# Patient Record
Sex: Female | Born: 2000 | Hispanic: No | Marital: Single | Smoking: Never smoker
Health system: Southern US, Community
[De-identification: ages and names within clinical notes are randomized; demographics above are authoritative.]

## PROBLEM LIST (undated history)

## (undated) DIAGNOSIS — R51 Headache: Secondary | ICD-10-CM

## (undated) DIAGNOSIS — R569 Unspecified convulsions: Secondary | ICD-10-CM

## (undated) DIAGNOSIS — E739 Lactose intolerance, unspecified: Secondary | ICD-10-CM

## (undated) HISTORY — DX: Headache: R51

## (undated) HISTORY — DX: Lactose intolerance, unspecified: E73.9

## (undated) HISTORY — PX: MOUTH SURGERY: SHX715

---
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
## 2019-04-18 ENCOUNTER — Ambulatory Visit (INDEPENDENT_AMBULATORY_CARE_PROVIDER_SITE_OTHER): Payer: Medicaid Other | Admitting: Pediatrics

## 2019-04-18 ENCOUNTER — Encounter (INDEPENDENT_AMBULATORY_CARE_PROVIDER_SITE_OTHER): Payer: Self-pay | Admitting: Pediatrics

## 2019-04-18 VITALS — BP 108/62 | HR 80 | Ht 65.5 in | Wt 115.6 lb

## 2019-04-18 DIAGNOSIS — G40309 Generalized idiopathic epilepsy and epileptic syndromes, not intractable, without status epilepticus: Secondary | ICD-10-CM

## 2019-04-18 NOTE — Progress Notes (Signed)
Patient: Lindsay BlakeSelina S Rosario MRN: [id number] Sex: female DOB: Feb 07, 2001  Provider: Ellison CarwinWilliam Hickling, MD [facility]  Note type: Routine return visit  History of Present Illness: Referral Source: Brett AlbinoFaith Gardner, MD History from: grandfather, patient and [facility] chart Chief Complaint: Epilepsy- no seizures since last visit. Stopped medication 10/2018.  Sherald HessSelina S Rosario is a 18 y.o. female who returns on April 18, 2019 for the first time since September 29, 2017.  The patient has a history of primary generalized epilepsy with generalized convulsive seizures.  She took and tolerated lamotrigine.  We talked about obtaining a driver's license on her last visit; however, she decided to take herself off medication and was able to be successfully tapered as of December 2019.  Her last EEG was performed on July 21, 2016 and was normal.  We planned to perform an EEG after her last visit on September 29, 2017 but for some reason that did not take place.  She slowly came off the medication on her own and did not inform the office.  She presents today because she would like to have a learner's permit.  At this time given that she has had 6 months of seizure freedom off medication, I think that we can strongly advocate for her receiving a learner's permit.  If she remains seizure-free over a period of a year off medication, then she would be eligible for permanent provisional license or a permanent license as best I understand [place] law.  She has graduated from high school and hopes to attend [employer] and major in Surveyor, mineralsCriminal Justice.  Her health has been good.  She is sleeping well.  Her weight is up about 7 pounds since she was seen, but she looks quite well.  Review of Systems: A complete review of systems was assessed and was negative.  Past Medical History Diagnosis Date  . Headache(784.0)   . Seizures (HCC)    Hospitalizations: No., Head Injury: No., Nervous  System Infections: No., Immunizations up to date: Yes.    EEG Mar 02, 2013 was a normal record awake and asleep.   Head CT scan without contrast February 21, 2013 was normal. EEG July 16, 2013 was a normal waking record.  Head CT scan without contrast November 01, 2015 was normal EEG July 20, 2016 was a normal record with the patient awake, drowsy, and asleep.  Birth History 6 lbs. 5 oz. Infant born at 5440 weeks gestational age to a 19 year old g 3 p 0 0 2 0 female. Gestation was complicated by treatment with RhoGAM for Rh isoimmunization, one half pack per week smoking Mother received normal spontaneous vaginal delivery Nursery Course was complicated by jaundice Growth and Development was recalled as normal  Behavior History none  Surgical History History reviewed. No pertinent surgical history.  Family History family history is not on file. Family history is negative for migraines, seizures, intellectual disabilities, blindness, deafness, birth defects, chromosomal disorder, or autism.  Social History  Socioeconomic History  . Marital status: Single  . Years of education:  2613  . Highest education level:  High school graduate  Occupational History  . Not currently employed  Social Needs  . Financial resource strain: Not on file  . Food insecurity    Worry: Not on file    Inability: Not on file  . Transportation needs    Medical: Not on file    Non-medical: Not on file  Tobacco Use  . Smoking  status: Never Smoker  . Smokeless tobacco: Never Used  Substance and Sexual Activity  . Alcohol use: No  . Drug use: No  . Sexual activity: Never  Social History Narrative    Lindsay Rosario is a recent graduate from [employer]; she will be attending [employer]. She does well in school.    Lives with her grandparents  s No Known Allergies  Physical Exam BP 108/62   Pulse 80   Ht 5' 5.5" (1.664 m)   Wt 115 lb 9.6 oz (52.4 kg)   BMI 18.94 kg/m   General: alert,  well developed, well nourished, in no acute distress, black hair, brown eyes, right handed Head: normocephalic, no dysmorphic features Ears, Nose and Throat: Otoscopic: tympanic membranes normal; pharynx: oropharynx is pink without exudates or tonsillar hypertrophy Neck: supple, full range of motion, no cranial or cervical bruits Respiratory: auscultation clear Cardiovascular: no murmurs, pulses are normal Musculoskeletal: no skeletal deformities or apparent scoliosis Skin: no rashes or neurocutaneous lesions  Neurologic Exam  Mental Status: alert; oriented to person, place and year; knowledge is normal for age; language is normal Cranial Nerves: visual fields are full to double simultaneous stimuli; extraocular movements are full and conjugate; pupils are round reactive to light; funduscopic examination shows sharp disc margins with normal vessels; symmetric facial strength; midline tongue and uvula; air conduction is greater than bone conduction bilaterally Motor: Normal strength, tone and mass; good fine motor movements; no pronator drift Sensory: intact responses to cold, vibration, proprioception and stereognosis Coordination: good finger-to-nose, rapid repetitive alternating movements and finger apposition Gait and Station: normal gait and station: patient is able to walk on heels, toes and tandem without difficulty; balance is adequate; Romberg exam is negative; Gower response is negative Reflexes: symmetric and diminished bilaterally; no clonus; bilateral flexor plantar responses  Assessment 1.  Generalized convulsive epilepsy, G40.309.  This is resolved and she has been seizure-free off medication for 6 months.  Discussion I am pleased that she is off medication.  Though I would have preferred to participate in the plan to taper and discontinue her medication, there have been no seizures in 6 months off medication.    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   She will not need to return to see me unless she has recurrent seizures.  I will be happy to fill out subsequent forms from [facility], but we will need to speak to her and inform her that we have to charge her to complete the form.   Greater than 50% of 15 minute visit was spent in counseling and coordination of care.   Medication List   Accurate as of April 18, 2019 11:59 PM. If you have any questions, ask your nurse or doctor.    No prescribed medications    The medication list was reviewed and reconciled. All changes or newly prescribed medications were explained.  A complete medication list was provided to the patient/caregiver.  Jodi Geralds MD

## 2019-04-18 NOTE — Patient Instructions (Addendum)
I am pleased that you are doing well and have been off medication since December without any seizures.  I think that you should be able to get a learner's permit I hope that will not become a problem.  It is been my experience that if you are seizure-free off medication for 6 months that you should be able to get a learner's permit when you are seizure-free off medication for a year you should be able to get a permanent provisional and a permanent license.  Please let me know if there is anything I can do to help.  If you are send another form 6 months from now send it to my office and I will get in touch with you.

## 2019-09-25 ENCOUNTER — Encounter (HOSPITAL_COMMUNITY): Payer: Self-pay

## 2019-09-25 ENCOUNTER — Other Ambulatory Visit: Payer: Self-pay

## 2019-09-25 ENCOUNTER — Ambulatory Visit (HOSPITAL_COMMUNITY)
Admission: EM | Admit: 2019-09-25 | Discharge: 2019-09-25 | Disposition: A | Discharge status: Home / Self Care | Payer: Medicaid Other | Attending: Family Medicine | Admitting: Family Medicine

## 2019-09-25 DIAGNOSIS — Z20828 Contact with and (suspected) exposure to other viral communicable diseases: Secondary | ICD-10-CM

## 2019-09-25 DIAGNOSIS — Z20822 Contact with and (suspected) exposure to covid-19: Secondary | ICD-10-CM

## 2019-09-25 DIAGNOSIS — J069 Acute upper respiratory infection, unspecified: Secondary | ICD-10-CM | POA: Insufficient documentation

## 2019-09-25 MED ORDER — CETIRIZINE HCL 10 MG PO CAPS: 10.0000 mg | ORAL_CAPSULE | Freq: Every day | ORAL | 0 refills | Status: DC

## 2019-09-25 NOTE — ED Triage Notes (Signed)
Pt presents for covid testing with complaints of intermittent headache and nasal drainage X 3 days.

## 2019-09-25 NOTE — ED Provider Notes (Signed)
[facility]    CSN: [id number] Arrival date & time: 09/25/19  1849      History   Chief Complaint Chief Complaint  Patient presents with  . Covid Testing    HPI Lindsay Rosario is a 18 y.o. female history of headaches, epilepsy, presenting today for an of headache and nasal congestion.  She has had symptoms for approximately 3 to 4 days.  She denies any fevers.  Denies cough or sore throat.  Has been using some ibuprofen occasionally for her headaches, no other symptoms.  Brother is here with similar symptoms.  Recently found out that her grandfather tested positive for Covid who they were recently around.  Denies any chest pain or shortness of breath.  HPI  Past Medical History:  Diagnosis Date  . Headache(784.0)   . Seizures [facility])     Patient Active Problem List   Diagnosis Date Noted  . 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 No pertinent surgical history.  OB History   No obstetric history on file.      Home Medications    Prior to Admission medications   Medication Sig Start Date End Date Taking? Authorizing Provider  Cetirizine HCl 10 MG CAPS Take 1 capsule (10 mg total) by mouth daily for 10 days. 09/25/19 10/05/19  Wieters, Junius Creamer, PA-C    Family History History reviewed. No pertinent family history.  Social History Social History   Tobacco Use  . Smoking status: Never Smoker  . Smokeless tobacco: Never Used  Substance Use Topics  . Alcohol use: No  . Drug use: No     Allergies   Patient has no known allergies.   Review of Systems Review of Systems  Constitutional: Negative for activity change, appetite change, chills, fatigue and fever.  HENT: Positive for congestion and rhinorrhea. Negative for ear pain, sinus pressure, sore throat and trouble swallowing.   Eyes: Negative for discharge and redness.  Respiratory: Negative for cough, chest tightness and shortness of breath.   Cardiovascular:  Negative for chest pain.  Gastrointestinal: Negative for abdominal pain, diarrhea, nausea and vomiting.  Musculoskeletal: Negative for myalgias.  Skin: Negative for rash.  Neurological: Positive for headaches. Negative for dizziness and light-headedness.     Physical Exam Triage Vital Signs ED Triage Vitals  Enc Vitals Group     BP 09/25/19 1925 124/74     Pulse Rate 09/25/19 1923 (!) 120     Resp 09/25/19 1923 17     Temp 09/25/19 1923 99.1 F (37.3 C)     Temp Source 09/25/19 1923 Oral     SpO2 09/25/19 1923 96 %     Weight --      Height --      Head Circumference --      Peak Flow --      Pain Score 09/25/19 1924 0     Pain Loc --      Pain Edu? --      Excl. in GC? --    No data found.  Updated Vital Signs BP 124/74 (BP Location: Left Arm)   Pulse 85   Temp 99.1 F (37.3 C) (Oral)   Resp 17   SpO2 96%   Visual Acuity Right Eye Distance:   Left Eye Distance:   Bilateral Distance:    Right Eye Near:   Left Eye Near:    Bilateral Near:     Physical Exam Vitals signs and nursing note reviewed.  Constitutional:      General: She is not in acute distress.    Appearance: She is well-developed.  HENT:     Head: Normocephalic and atraumatic.     Ears:     Comments: Bilateral ears without tenderness to palpation of external auricle, tragus and mastoid, EAC's without erythema or swelling, TM's with good bony landmarks and cone of light. Non erythematous.    Mouth/Throat:     Comments: Oral mucosa pink and moist, no tonsillar enlargement or exudate. Posterior pharynx patent and nonerythematous, no uvula deviation or swelling. Normal phonation. Eyes:     Conjunctiva/sclera: Conjunctivae normal.  Neck:     Musculoskeletal: Neck supple.  Cardiovascular:     Rate and Rhythm: Normal rate and regular rhythm.     Heart sounds: No murmur.  Pulmonary:     Effort: Pulmonary effort is normal. No respiratory distress.     Breath sounds: Normal breath sounds.      Comments: Breathing comfortably at rest, CTABL, no wheezing, rales or other adventitious sounds auscultated Abdominal:     Palpations: Abdomen is soft.     Tenderness: There is no abdominal tenderness.  Skin:    General: Skin is warm and dry.  Neurological:     Mental Status: She is alert.      UC Treatments / Results  Labs (all labs ordered are listed, but only abnormal results are displayed) Labs Reviewed  NOVEL CORONAVIRUS, NAA (HOSP ORDER, SEND-OUT TO REF LAB; TAT 18-24 HRS)    EKG   Radiology No results found.  Procedures Procedures (including critical care time)  Medications Ordered in UC Medications - No data to display  Initial Impression / Assessment and Plan / UC Course  I have reviewed the triage vital signs and the nursing notes.  Pertinent labs & imaging results that were available during my care of the patient were reviewed by me and considered in my medical decision making (see chart for details).     URI symptoms x3 to 4 days, vital signs stable, exam unremarkable, lungs clear.  Recent Covid exposure.  Covid swab pending.  Continuing symptomatic and supportive care.  Push fluids and rest.  Quarantine until results return.Discussed strict return precautions. Patient verbalized understanding and is agreeable with plan.  Final Clinical Impressions(s) / UC Diagnoses   Final diagnoses:  Viral URI  Exposure to COVID-19 virus     Discharge Instructions        Person Under Monitoring Name: Lindsay Rosario  Location: [address] [place] [address]   Infection Prevention Recommendations for Individuals Confirmed to have, or Being Evaluated for, 2019 Novel Coronavirus (COVID-19) Infection Who Receive Care at Home  Individuals who are confirmed to have, or are being evaluated for, COVID-19 should follow the prevention steps below until a healthcare provider or local or state health department says they can return to normal activities.   Stay home except to get medical care You should restrict activities outside your home, except for getting medical care. Do not go to work, school, or public areas, and do not use public transportation or taxis.  Call ahead before visiting your doctor Before your medical appointment, call the healthcare provider and tell them that you have, or are being evaluated for, COVID-19 infection. This will help the healthcare provider's office take steps to keep other people from getting infected. Ask your healthcare provider to call the local or state health department.  Monitor your symptoms Seek prompt medical attention  if your illness is worsening (e.g., difficulty breathing). Before going to your medical appointment, call the healthcare provider and tell them that you have, or are being evaluated for, COVID-19 infection. Ask your healthcare provider to call the local or state health department.  Wear a facemask You should wear a facemask that covers your nose and mouth when you are in the same room with other people and when you visit a healthcare provider. People who live with or visit you should also wear a facemask while they are in the same room with you.  Separate yourself from other people in your home As much as possible, you should stay in a different room from other people in your home. Also, you should use a separate bathroom, if available.  Avoid sharing household items You should not share dishes, drinking glasses, cups, eating utensils, towels, bedding, or other items with other people in your home. After using these items, you should wash them thoroughly with soap and water.  Cover your coughs and sneezes Cover your mouth and nose with a tissue when you cough or sneeze, or you can cough or sneeze into your sleeve. Throw used tissues in a lined trash can, and immediately wash your hands with soap and water for at least 20 seconds or use an alcohol-based hand rub.  Wash your  [employer] your hands often and thoroughly with soap and water for at least 20 seconds. You can use an alcohol-based hand sanitizer if soap and water are not available and if your hands are not visibly dirty. Avoid touching your eyes, nose, and mouth with unwashed hands.   Prevention Steps for Caregivers and Household Members of Individuals Confirmed to have, or Being Evaluated for, COVID-19 Infection Being Cared for in the Home  If you live with, or provide care at home for, a person confirmed to have, or being evaluated for, COVID-19 infection please follow these guidelines to prevent infection:  Follow healthcare provider's instructions Make sure that you understand and can help the patient follow any healthcare provider instructions for all care.  Provide for the patient's basic needs You should help the patient with basic needs in the home and provide support for getting groceries, prescriptions, and other personal needs.  Monitor the patient's symptoms If they are getting sicker, call his or her medical provider and tell them that the patient has, or is being evaluated for, COVID-19 infection. This will help the healthcare provider's office take steps to keep other people from getting infected. Ask the healthcare provider to call the local or state health department.  Limit the number of people who have contact with the patient  If possible, have only one caregiver for the patient.  Other household members should stay in another home or place of residence. If this is not possible, they should stay  in another room, or be separated from the patient as much as possible. Use a separate bathroom, if available.  Restrict visitors who do not have an essential need to be in the home.  Keep older adults, very young children, and other sick people away from the patient Keep older adults, very young children, and those who have compromised immune systems or chronic health conditions  away from the patient. This includes people with chronic heart, lung, or kidney conditions, diabetes, and cancer.  Ensure good ventilation Make sure that shared spaces in the home have good air flow, such as from an air conditioner or an opened window, weather permitting.  Wash your hands often  Wash your hands often and thoroughly with soap and water for at least 20 seconds. You can use an alcohol based hand sanitizer if soap and water are not available and if your hands are not visibly dirty.  Avoid touching your eyes, nose, and mouth with unwashed hands.  Use disposable paper towels to dry your hands. If not available, use dedicated cloth towels and replace them when they become wet.  Wear a facemask and gloves  Wear a disposable facemask at all times in the room and gloves when you touch or have contact with the patient's blood, body fluids, and/or secretions or excretions, such as sweat, saliva, sputum, nasal mucus, vomit, urine, or feces.  Ensure the mask fits over your nose and mouth tightly, and do not touch it during use.  Throw out disposable facemasks and gloves after using them. Do not reuse.  Wash your hands immediately after removing your facemask and gloves.  If your personal clothing becomes contaminated, carefully remove clothing and launder. Wash your hands after handling contaminated clothing.  Place all used disposable facemasks, gloves, and other waste in a lined container before disposing them with other household waste.  Remove gloves and wash your hands immediately after handling these items.  Do not share dishes, glasses, or other household items with the patient  Avoid sharing household items. You should not share dishes, drinking glasses, cups, eating utensils, towels, bedding, or other items with a patient who is confirmed to have, or being evaluated for, COVID-19 infection.  After the person uses these items, you should wash them thoroughly with soap and  water.  Wash laundry thoroughly  Immediately remove and wash clothes or bedding that have blood, body fluids, and/or secretions or excretions, such as sweat, saliva, sputum, nasal mucus, vomit, urine, or feces, on them.  Wear gloves when handling laundry from the patient.  Read and follow directions on labels of laundry or clothing items and detergent. In general, wash and dry with the warmest temperatures recommended on the label.  Clean all areas the individual has used often  Clean all touchable surfaces, such as counters, tabletops, doorknobs, bathroom fixtures, toilets, phones, keyboards, tablets, and bedside tables, every day. Also, clean any surfaces that may have blood, body fluids, and/or secretions or excretions on them.  Wear gloves when cleaning surfaces the patient has come in contact with.  Use a diluted bleach solution (e.g., dilute bleach with 1 part bleach and 10 parts water) or a household disinfectant with a label that says EPA-registered for coronaviruses. To make a bleach solution at home, add 1 tablespoon of bleach to 1 quart (4 cups) of water. For a larger supply, add  cup of bleach to 1 gallon (16 cups) of water.  Read labels of cleaning products and follow recommendations provided on product labels. Labels contain instructions for safe and effective use of the cleaning product including precautions you should take when applying the product, such as wearing gloves or eye protection and making sure you have good ventilation during use of the product.  Remove gloves and wash hands immediately after cleaning.  Monitor yourself for signs and symptoms of illness Caregivers and household members are considered close contacts, should monitor their health, and will be asked to limit movement outside of the home to the extent possible. Follow the monitoring steps for close contacts listed on the symptom monitoring form.   ? If you have additional questions, contact your  local health department or  call the epidemiologist on call at [phone] (available 24/7). ? This guidance is subject to change. For the most up-to-date guidance from [facility], please refer to their website: TripMetro.huhttps://www.cdc.gov/coronavirus/2019-ncov/hcp/guidance-prevent-spread.html    ED Prescriptions    Medication Sig Dispense Auth. Provider   Cetirizine HCl 10 MG CAPS Take 1 capsule (10 mg total) by mouth daily for 10 days. 10 capsule Wieters, LibertyHallie C, PA-C     PDMP not reviewed this encounter.   Lew DawesWieters, Hallie C, [place] JerseyPA-C 09/25/19 1947

## 2019-09-25 NOTE — Discharge Instructions (Signed)
Person Under Monitoring Name: LEEBA BARBE  Location: [address] [place] [address]   Infection Prevention Recommendations for Individuals Confirmed to have, or Being Evaluated for, 2019 Novel Coronavirus (COVID-19) Infection Who Receive Care at Home  Individuals who are confirmed to have, or are being evaluated for, COVID-19 should follow the prevention steps below until a healthcare provider or local or state health department says they can return to normal activities.  Stay home except to get medical care You should restrict activities outside your home, except for getting medical care. Do not go to work, school, or public areas, and do not use public transportation or taxis.  Call ahead before visiting your doctor Before your medical appointment, call the healthcare provider and tell them that you have, or are being evaluated for, COVID-19 infection. This will help the healthcare providers office take steps to keep other people from getting infected. Ask your healthcare provider to call the local or state health department.  Monitor your symptoms Seek prompt medical attention if your illness is worsening (e.g., difficulty breathing). Before going to your medical appointment, call the healthcare provider and tell them that you have, or are being evaluated for, COVID-19 infection. Ask your healthcare provider to call the local or state health department.  Wear a facemask You should wear a facemask that covers your nose and mouth when you are in the same room with other people and when you visit a healthcare provider. People who live with or visit you should also wear a facemask while they are in the same room with you.  Separate yourself from other people in your home As much as possible, you should stay in a different room from other people in your home. Also, you should use a separate bathroom, if available.  Avoid sharing household items You should  not share dishes, drinking glasses, cups, eating utensils, towels, bedding, or other items with other people in your home. After using these items, you should wash them thoroughly with soap and water.  Cover your coughs and sneezes Cover your mouth and nose with a tissue when you cough or sneeze, or you can cough or sneeze into your sleeve. Throw used tissues in a lined trash can, and immediately wash your hands with soap and water for at least 20 seconds or use an alcohol-based hand rub.  Wash your [employer] your hands often and thoroughly with soap and water for at least 20 seconds. You can use an alcohol-based hand sanitizer if soap and water are not available and if your hands are not visibly dirty. Avoid touching your eyes, nose, and mouth with unwashed hands.   Prevention Steps for Caregivers and Household Members of Individuals Confirmed to have, or Being Evaluated for, COVID-19 Infection Being Cared for in the Home  If you live with, or provide care at home for, a person confirmed to have, or being evaluated for, COVID-19 infection please follow these guidelines to prevent infection:  Follow healthcare providers instructions Make sure that you understand and can help the patient follow any healthcare provider instructions for all care.  Provide for the patients basic needs You should help the patient with basic needs in the home and provide support for getting groceries, prescriptions, and other personal needs.  Monitor the patients symptoms If they are getting sicker, call his or her medical provider and tell them that the patient has, or is being evaluated for, COVID-19 infection. This will help the healthcare providers  office take steps to keep other people from getting infected. Ask the healthcare provider to call the local or state health department.  Limit the number of people who have contact with the patient If possible, have only one caregiver for the  patient. Other household members should stay in another home or place of residence. If this is not possible, they should stay in another room, or be separated from the patient as much as possible. Use a separate bathroom, if available. Restrict visitors who do not have an essential need to be in the home.  Keep older adults, very young children, and other sick people away from the patient Keep older adults, very young children, and those who have compromised immune systems or chronic health conditions away from the patient. This includes people with chronic heart, lung, or kidney conditions, diabetes, and cancer.  Ensure good ventilation Make sure that shared spaces in the home have good air flow, such as from an air conditioner or an opened window, weather permitting.  Wash your hands often Wash your hands often and thoroughly with soap and water for at least 20 seconds. You can use an alcohol based hand sanitizer if soap and water are not available and if your hands are not visibly dirty. Avoid touching your eyes, nose, and mouth with unwashed hands. Use disposable paper towels to dry your hands. If not available, use dedicated cloth towels and replace them when they become wet.  Wear a facemask and gloves Wear a disposable facemask at all times in the room and gloves when you touch or have contact with the patients blood, body fluids, and/or secretions or excretions, such as sweat, saliva, sputum, nasal mucus, vomit, urine, or feces.  Ensure the mask fits over your nose and mouth tightly, and do not touch it during use. Throw out disposable facemasks and gloves after using them. Do not reuse. Wash your hands immediately after removing your facemask and gloves. If your personal clothing becomes contaminated, carefully remove clothing and launder. Wash your hands after handling contaminated clothing. Place all used disposable facemasks, gloves, and other waste in a lined container before  disposing them with other household waste. Remove gloves and wash your hands immediately after handling these items.  Do not share dishes, glasses, or other household items with the patient Avoid sharing household items. You should not share dishes, drinking glasses, cups, eating utensils, towels, bedding, or other items with a patient who is confirmed to have, or being evaluated for, COVID-19 infection. After the person uses these items, you should wash them thoroughly with soap and water.  Wash laundry thoroughly Immediately remove and wash clothes or bedding that have blood, body fluids, and/or secretions or excretions, such as sweat, saliva, sputum, nasal mucus, vomit, urine, or feces, on them. Wear gloves when handling laundry from the patient. Read and follow directions on labels of laundry or clothing items and detergent. In general, wash and dry with the warmest temperatures recommended on the label.  Clean all areas the individual has used often Clean all touchable surfaces, such as counters, tabletops, doorknobs, bathroom fixtures, toilets, phones, keyboards, tablets, and bedside tables, every day. Also, clean any surfaces that may have blood, body fluids, and/or secretions or excretions on them. Wear gloves when cleaning surfaces the patient has come in contact with. Use a diluted bleach solution (e.g., dilute bleach with 1 part bleach and 10 parts water) or a household disinfectant with a label that says EPA-registered for coronaviruses. To make a  bleach solution at home, add 1 tablespoon of bleach to 1 quart (4 cups) of water. For a larger supply, add  cup of bleach to 1 gallon (16 cups) of water. Read labels of cleaning products and follow recommendations provided on product labels. Labels contain instructions for safe and effective use of the cleaning product including precautions you should take when applying the product, such as wearing gloves or eye protection and making sure you  have good ventilation during use of the product. Remove gloves and wash hands immediately after cleaning.  Monitor yourself for signs and symptoms of illness Caregivers and household members are considered close contacts, should monitor their health, and will be asked to limit movement outside of the home to the extent possible. Follow the monitoring steps for close contacts listed on the symptom monitoring form.   ? If you have additional questions, contact your local health department or call the epidemiologist on call at [phone] (available 24/7). ? This guidance is subject to change. For the most up-to-date guidance from [facility], please refer to their website: YouBlogs.pl

## 2019-09-27 LAB — NOVEL CORONAVIRUS, NAA (HOSP ORDER, SEND-OUT TO REF LAB; TAT 18-24 HRS): SARS-CoV-2, NAA: NOT DETECTED

## 2019-11-30 ENCOUNTER — Other Ambulatory Visit: Payer: Self-pay

## 2019-11-30 ENCOUNTER — Ambulatory Visit
Admission: EM | Admit: 2019-11-30 | Discharge: 2019-11-30 | Disposition: A | Discharge status: Home / Self Care | Payer: Medicaid Other | Attending: Physician Assistant | Admitting: Physician Assistant

## 2019-11-30 DIAGNOSIS — J029 Acute pharyngitis, unspecified: Secondary | ICD-10-CM | POA: Diagnosis not present

## 2019-11-30 DIAGNOSIS — R509 Fever, unspecified: Secondary | ICD-10-CM | POA: Diagnosis not present

## 2019-11-30 DIAGNOSIS — R059 Cough, unspecified: Secondary | ICD-10-CM

## 2019-11-30 DIAGNOSIS — R05 Cough: Secondary | ICD-10-CM

## 2019-11-30 DIAGNOSIS — Z20822 Contact with and (suspected) exposure to covid-19: Secondary | ICD-10-CM

## 2019-11-30 DIAGNOSIS — R0981 Nasal congestion: Secondary | ICD-10-CM

## 2019-11-30 NOTE — ED Provider Notes (Signed)
[name]    CSN: [id number] Arrival date & time: 11/30/19  1735      History   Chief Complaint Chief Complaint  Patient presents with  . Headache    HPI Lindsay Rosario is a 19 y.o. female.   19 year old female comes in for 4 day of URI symptoms. Had cough, sore throat, headache, fatigue, rhinorrhea, nasal congestion, abdominal pain, nausea. Had temp of 100. Abdominal pain, nausea has since resolved. She has been taking theraflu, which has improved most of the symptoms, and now only feels mild headache and some fatigue. Denies shortness of breath, loss of taste/smell. Had positive sick contact without known COVID status.      Past Medical History:  Diagnosis Date  . Headache(784.0)   . Seizures [facility])     Patient Active Problem List   Diagnosis Date Noted  . 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 No pertinent surgical history.  OB History   No obstetric history on file.      Home Medications    Prior to Admission medications   Not on File    Family History No family history on file.  Social History Social History   Tobacco Use  . Smoking status: Never Smoker  . Smokeless tobacco: Never Used  Substance Use Topics  . Alcohol use: No  . Drug use: No     Allergies   Patient has no known allergies.   Review of Systems Review of Systems  Reason unable to perform ROS: See HPI as above.     Physical Exam Triage Vital Signs ED Triage Vitals [11/30/19 1751]  Enc Vitals Group     BP 111/79     Pulse Rate (!) 110     Resp 16     Temp 98.8 F (37.1 C)     Temp Source Oral     SpO2      Weight      Height      Head Circumference      Peak Flow      Pain Score 1     Pain Loc      Pain Edu?      Excl. in [place]?    No data found.  Updated Vital Signs BP 111/79 (BP Location: Left Arm)   Pulse (!) 110   Temp 98.8 F (37.1 C) (Oral)   Resp 16   SpO2 98%   Physical Exam Constitutional:        General: She is not in acute distress.    Appearance: Normal appearance. She is not ill-appearing, toxic-appearing or diaphoretic.  HENT:     Head: Normocephalic and atraumatic.     Mouth/Throat:     Mouth: Mucous membranes are moist.     Pharynx: Oropharynx is clear. Uvula midline.  Cardiovascular:     Rate and Rhythm: Normal rate and regular rhythm.     Heart sounds: Normal heart sounds. No murmur. No friction rub. No gallop.   Pulmonary:     Effort: Pulmonary effort is normal. No accessory muscle usage, prolonged expiration, respiratory distress or retractions.     Comments: Lungs clear to auscultation without adventitious lung sounds. Musculoskeletal:     Cervical back: Normal range of motion and neck supple.  Neurological:     General: No focal deficit present.     Mental Status: She is alert and oriented to person, place, and time.      UC Treatments /  Results  Labs (all labs ordered are listed, but only abnormal results are displayed) Labs Reviewed  NOVEL CORONAVIRUS, NAA    EKG   Radiology No results found.  Procedures Procedures (including critical care time)  Medications Ordered in UC Medications - No data to display  Initial Impression / Assessment and Plan / UC Course  I have reviewed the triage vital signs and the nursing notes.  Pertinent labs & imaging results that were available during my care of the patient were reviewed by me and considered in my medical decision making (see chart for details).     Patient was tachycardic at triage, resolved during exam. COVID PCR test ordered. Patient to quarantine until testing results return. No alarming signs on exam.  Patient speaking in full sentences without respiratory distress.  Symptomatic treatment discussed.  Push fluids.  Return precautions given.  Patient expresses understanding and agrees to plan.  * fever put in as diagnoses by RN for COVID testing. However, patient tmax 100.   Final Clinical  Impressions(s) / UC Diagnoses   Final diagnoses:  Fever, unspecified  Nasal congestion  Cough  Sore throat   ED Prescriptions    None     PDMP not reviewed this encounter.   Belinda Fisher, PA-C 11/30/19 1809

## 2019-11-30 NOTE — Discharge Instructions (Signed)
COVID PCR testing ordered. I would like you to quarantine until testing results. Continue over the counter medicine for symptoms. Tylenol/motrin for pain and fever. Keep hydrated, urine should be clear to pale yellow in color. If experiencing shortness of breath, trouble breathing, go to the emergency department for further evaluation needed.

## 2019-11-30 NOTE — ED Triage Notes (Signed)
Pt states had a fever, cough, sore throat, headache and fatigue on Monday, subside after 3 days. States still feels tired and a slight headache. States need covid testing to return to work.

## 2019-12-02 LAB — NOVEL CORONAVIRUS, NAA: SARS-CoV-2, NAA: NOT DETECTED

## 2020-05-23 ENCOUNTER — Ambulatory Visit (INDEPENDENT_AMBULATORY_CARE_PROVIDER_SITE_OTHER): Payer: Medicaid Other | Admitting: Family

## 2020-05-23 ENCOUNTER — Other Ambulatory Visit: Payer: Self-pay

## 2020-05-23 ENCOUNTER — Encounter (INDEPENDENT_AMBULATORY_CARE_PROVIDER_SITE_OTHER): Payer: Self-pay | Admitting: Family

## 2020-05-23 VITALS — BP 108/72 | HR 80 | Ht 65.5 in | Wt 119.2 lb

## 2020-05-23 DIAGNOSIS — G40309 Generalized idiopathic epilepsy and epileptic syndromes, not intractable, without status epilepticus: Secondary | ICD-10-CM | POA: Diagnosis not present

## 2020-05-23 NOTE — Progress Notes (Signed)
Lindsay Rosario   MRN:  [id number]  February 29, 2001   Provider: Elveria Rising NP-C Location of Care: [facility] Child Neurology  Visit type: Routine visit  Last visit: 04/18/2019  Referral source: Brett Albino, MD History from: patient and chcn chart  Brief history:  Copied from previous record: History of primary generalized epilepsy with generalized convulsive seizures.  She took and tolerated lamotrigine. She self tapered off Lamotrigine in December 2019.  Today's concerns:  Lindsay Rosario is seen today because she received a DMV form to be completed. She was last seen by Dr Sharene Skeans in June 2020. Lindsay Rosario says that she has remained seizure free off Lamotrigine. She works part time and is a Consulting civil engineer. She is interested in a career in radiology.   Nicolet has been otherwise generally healthy since he was last seen. She has other health concerns today other than previously mentioned.  Review of systems: Please see HPI for neurologic and other pertinent review of systems. Otherwise all other systems were reviewed and were negative.  Problem List: Patient Active Problem List   Diagnosis Date Noted  . 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 Headache(784.0)   . Seizures (HCC)     Past medical history comments: See HPI Copied from previous record: EEG Mar 02, 2013 was a normal record awake and asleep.   Head CT scan without contrast February 21, 2013 was normal. EEG July 16, 2013 was a normal waking record.  Head CT scan without contrast November 01, 2015 was normal EEG July 20, 2016 was a normal record with the patient awake, drowsy, and asleep.  Birth History 6 lbs. 5 oz. Infant born at [redacted] weeks gestational age to a 20 year old g 3 p 0 0 2 0 female. Gestation was complicated by treatment with RhoGAM for Rh isoimmunization, one half pack per week smoking Mother received normal spontaneous vaginal delivery Nursery  Course was complicated by jaundice Growth and Development was recalled as normal  Surgical history: History reviewed. No pertinent surgical history.   Family history: family history is not on file.   Social history: Social History   Socioeconomic History  . Marital status: Single    Spouse name: Not on file  . Number of children: Not on file  . Years of education: Not on file  . Highest education level: Not on file  Occupational History  . Not on file  Tobacco Use  . Smoking status: Never Smoker  . Smokeless tobacco: Never Used  Substance and Sexual Activity  . Alcohol use: No  . Drug use: No  . Sexual activity: Never    Birth control/protection: Injection  Other Topics Concern  . Not on file  Social History Narrative   [name] is a recent graduate from [employer]; she attends [employer] and majors in Radiology. She does well in school.   Lives with her grandparents   Social Determinants of Health   Financial Resource Strain:   . Difficulty of Paying Living Expenses:   Food Insecurity:   . Worried About Programme researcher, broadcasting/film/video in the Last Year:   . Barista in the Last Year:   Transportation Needs:   . Freight forwarder (Medical):   Lindsay Rosario Lack of Transportation (Non-Medical):   Physical Activity:   . Days of Exercise per Week:   . Minutes of Exercise per Session:   Stress:   . Feeling of Stress :  Social Connections:   . Frequency of Communication with Friends and Family:   . Frequency of Social Gatherings with Friends and Family:   . Attends Religious Services:   . Active Member of Clubs or Organizations:   . Attends Banker Meetings:   Lindsay Rosario Marital Status:   Intimate Partner Violence:   . Fear of Current or Ex-Partner:   . Emotionally Abused:   Lindsay Rosario Physically Abused:   . Sexually Abused:     Past/failed meds: Lamotrigine  Allergies: No Known Allergies    Immunizations:  There is no immunization history on file for this  patient.    Diagnostics/Screenings: 07/20/2016 - This is a normal record with the patient awake, drowsy and asleep. [name], MD  Physical Exam: BP 108/72   Pulse 80   Ht 5' 5.5" (1.664 m)   Wt 119 lb 3.2 oz (54.1 kg)   BMI 19.53 kg/m   General: Well developed, well nourished young woman, seated on exam table, in no evident distress, black hair, brown eyes, right handed Head: Head normocephalic and atraumatic.  Oropharynx benign. Neck: Supple with no carotid bruits Cardiovascular: Regular rate and rhythm, no murmurs Respiratory: Breath sounds clear to auscultation Musculoskeletal: No obvious deformities or scoliosis Skin: No rashes or neurocutaneous lesions  Neurologic Exam Mental Status: Awake and fully alert.  Oriented to place and time.  Recent and remote memory intact.  Attention span, concentration, and fund of knowledge appropriate.  Mood and affect appropriate. Cranial Nerves: Fundoscopic exam reveals sharp disc margins.  Pupils equal, briskly reactive to light.  Extraocular movements full without nystagmus.  Visual fields full to confrontation.  Hearing intact and symmetric to finger rub.  Facial sensation intact.  Face tongue, palate move normally and symmetrically.  Neck flexion and extension normal. Motor: Normal bulk and tone. Normal strength in all tested extremity muscles. Sensory: Intact to touch and temperature in all extremities.  Coordination: Rapid alternating movements normal in all extremities.  Finger-to-nose and heel-to shin performed accurately bilaterally.  Romberg negative. Gait and Station: Arises from chair without difficulty.  Stance is normal. Gait demonstrates normal stride length and balance.   Able to heel, toe and tandem walk without difficulty. Reflexes: 1+ and symmetric. Toes downgoing.  Impression: 1. Generalized convulsive epilepsy  Recommendations for plan of care: The patient's previous [facility] records were reviewed. Patrycja has neither  had nor required imaging or lab studies since the last visit. She is a 19 year old woman with history of generalized convulsive epilepsy. She self tapered off Lamotrigine in December 2019 and has remained seizure free. She needs a DMV form completed, which I will do. I instructed Amani to contact Dr Sharene Skeans if she has any seizures. She does not otherwise need to return for follow up unless breakthrough seizures occur. Sharai agreed with the plans made today.   The medication list was reviewed and reconciled. No changes were made in the prescribed medications today. A complete medication list was provided to the patient.  Allergies as of 05/23/2020   No Known Allergies     Medication List    as of May 23, 2020 11:27 AM   You have not been prescribed any medications.     Total time spent with the patient was 20 minutes, of which 50% or more was spent in counseling and coordination of care.  Elveria Rising NP-C [facility] Child Neurology Ph. [phone] Fax [phone]

## 2020-05-26 ENCOUNTER — Encounter (INDEPENDENT_AMBULATORY_CARE_PROVIDER_SITE_OTHER): Payer: Self-pay | Admitting: Family

## 2020-05-26 NOTE — Patient Instructions (Signed)
Thank you for coming in today.   Instructions for you until your next appointment are as follows: 1. I will complete the DMV form and send it in for you 2. Let Dr Sharene Skeans know if you have any seizures 3. Please sign up for MyChart if you have not done so 4. You do not need to return for follow up unless you have seizures.

## 2020-08-23 ENCOUNTER — Telehealth: Payer: Medicaid Other | Admitting: Emergency Medicine

## 2020-08-23 DIAGNOSIS — R300 Dysuria: Secondary | ICD-10-CM

## 2020-08-23 MED ORDER — NITROFURANTOIN MONOHYD MACRO 100 MG PO CAPS
100.0000 mg | ORAL_CAPSULE | Freq: Two times a day (BID) | ORAL | 0 refills | Status: DC
Start: 2020-08-23 — End: 2021-09-11

## 2020-08-23 NOTE — Progress Notes (Signed)
**  Please do not respond to this message unless you have follow up questions.**  We are sorry that you are not feeling well.  Here is how we plan to help!  Based on what you shared with me it looks like you most likely have a simple urinary tract infection.  A UTI (Urinary Tract Infection) is a bacterial infection of the bladder.  Most cases of urinary tract infections are simple to treat but a key part of your care is to encourage you to drink plenty of fluids and watch your symptoms carefully.  I have prescribed MacroBid 100 mg twice a day for 5 days.  Your symptoms should gradually improve. Call us if the burning in your urine worsens, you develop worsening fever, back pain or pelvic pain or if your symptoms do not resolve after completing the antibiotic.  Urinary tract infections can be prevented by drinking plenty of water to keep your body hydrated.  Also be sure when you wipe, wipe from front to back and don't hold it in!  If possible, empty your bladder every 4 hours.  Your e-visit answers were reviewed by a board certified advanced clinical practitioner to complete your personal care plan.  Depending on the condition, your plan could have included both over the counter or prescription medications.  If there is a problem please reply  once you have received a response from your provider.  Your safety is important to us.  If you have drug allergies check your prescription carefully.    You can use MyChart to ask questions about today's visit, request a non-urgent call back, or ask for a work or school excuse for 24 hours related to this e-Visit. If it has been greater than 24 hours you will need to follow up with your provider, or enter a new e-Visit to address those concerns.   You will get an e-mail in the next two days asking about your experience.  I hope that your e-visit has been valuable and will speed your recovery. Thank you for using e-visits.    Greater than 5 but less  than 10 minutes spent researching, coordinating, and implementing care for this patient today  

## 2021-03-06 ENCOUNTER — Encounter (INDEPENDENT_AMBULATORY_CARE_PROVIDER_SITE_OTHER): Payer: Self-pay

## 2021-09-11 ENCOUNTER — Encounter: Payer: Self-pay | Admitting: Adult Health

## 2021-09-11 ENCOUNTER — Ambulatory Visit (INDEPENDENT_AMBULATORY_CARE_PROVIDER_SITE_OTHER): Payer: No Typology Code available for payment source | Admitting: Adult Health

## 2021-09-11 VITALS — BP 110/70 | HR 96 | Temp 99.7°F | Ht 65.5 in | Wt 120.0 lb

## 2021-09-11 DIAGNOSIS — H8113 Benign paroxysmal vertigo, bilateral: Secondary | ICD-10-CM | POA: Diagnosis not present

## 2021-09-11 DIAGNOSIS — H6690 Otitis media, unspecified, unspecified ear: Secondary | ICD-10-CM | POA: Diagnosis not present

## 2021-09-11 MED ORDER — AMOXICILLIN-POT CLAVULANATE 875-125 MG PO TABS: 1.0000 | ORAL_TABLET | Freq: Two times a day (BID) | ORAL | 0 refills | Status: DC

## 2021-09-11 MED ORDER — FLUTICASONE PROPIONATE 50 MCG/ACT NA SUSP: 2.0000 | Freq: Every day | NASAL | 6 refills | Status: DC

## 2021-09-11 NOTE — Progress Notes (Signed)
Subjective:    Patient ID: Lindsay Rosario, female    DOB: [birth year], 20 y.o.   MRN: [id number]  HPI 20 year old female who  has a past medical history of Headache(784.0) and Seizures (HCC).  She is being evaluated today for an acute issue.  Her symptoms started roughly 4 days ago.  Symptoms started with a sore throat that has since resolved.  The next day she started to develop rhinorrhea and then was woken up with an intense ringing in her left ear.  Now she has loss of hearing, sounds like hearing is muffled", left-sided ear pain and pressure, and nasal congestion.  Today she started to experience dizziness, "like the world is spinning around me" with change of positions and looking up and down.  Has not had any fevers or chills.  Has been using Tylenol at home with no improvement.  She tested negative for flu yesterday.  Review of Systems See HPI   Past Medical History:  Diagnosis Date   Headache(784.0)    Seizures (HCC)     Social History   Socioeconomic History   Marital status: Significant Other    Spouse name: Not on file   Number of children: Not on file   Years of education: Not on file   Highest education level: Not on file  Occupational History   Not on file  Tobacco Use   Smoking status: Never   Smokeless tobacco: Never  Substance and Sexual Activity   Alcohol use: No   Drug use: No   Sexual activity: Never    Birth control/protection: Injection  Other Topics Concern   Not on file  Social History Narrative   Algie is a recent graduate from [employer]; 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 She does well in school.   Lives with her grandparents   Social Determinants of Health   Financial Resource Strain: Not on file  Food Insecurity: Not on file  Transportation Needs: Not on file  Physical Activity: Not on file  Stress: Not on file  Social Connections: Not on file  Intimate Partner Violence: Not on file    History reviewed. No  pertinent surgical history.  History reviewed. No pertinent family history.  Allergies  Allergen Reactions   Kiwi Extract     Lip goes numb     No current outpatient medications on file prior to visit.   No current facility-administered medications on file prior to visit.    BP 110/70   Pulse 96   Temp 99.7 F (37.6 C) (Oral)   Ht 5' 5.5" (1.664 m)   Wt 120 lb (54.4 kg)   SpO2 97%   BMI 19.67 kg/m       Objective:   Physical Exam Vitals and nursing note reviewed.  Constitutional:      Appearance: Normal appearance.  HENT:     Right Ear: Hearing, tympanic membrane, ear canal and external ear normal.     Left Ear: A middle ear effusion is present. Tympanic membrane is erythematous and bulging.     Nose: Nose normal. No congestion or rhinorrhea.     Mouth/Throat:     Mouth: Mucous membranes are moist.     Pharynx: Oropharynx is clear.  Eyes:     Extraocular Movements: Extraocular movements intact.     Conjunctiva/sclera: Conjunctivae normal.     Pupils: Pupils are equal, round, and reactive to light.  Musculoskeletal:     Cervical back: No rigidity  or tenderness.  Lymphadenopathy:     Cervical: No cervical adenopathy.  Skin:    General: Skin is warm and dry.     Capillary Refill: Capillary refill takes less than 2 seconds.  Neurological:     Mental Status: She is alert.  Psychiatric:        Mood and Affect: Mood normal.        Behavior: Behavior normal.        Thought Content: Thought content normal.        Judgment: Judgment normal.      Assessment & Plan:  1. Acute otitis media, unspecified otitis media type  - amoxicillin-clavulanate (AUGMENTIN) 875-125 MG tablet; Take 1 tablet by mouth 2 (two) times daily.  Dispense: 20 tablet; Refill: 0 - fluticasone (FLONASE) 50 MCG/ACT nasal spray; Place 2 sprays into both nostrils daily.  Dispense: 16 g; Refill: 6   2. Benign paroxysmal positional vertigo due to bilateral vestibular disorder - Advised OTC  meclizine, should resolve with resolution of otitis media   Shirline Frees, NP

## 2021-10-10 ENCOUNTER — Other Ambulatory Visit: Payer: Self-pay

## 2021-10-13 ENCOUNTER — Other Ambulatory Visit: Payer: Self-pay

## 2021-10-13 ENCOUNTER — Encounter: Payer: Self-pay | Admitting: Nurse Practitioner

## 2021-10-13 ENCOUNTER — Ambulatory Visit (INDEPENDENT_AMBULATORY_CARE_PROVIDER_SITE_OTHER): Payer: No Typology Code available for payment source | Admitting: Nurse Practitioner

## 2021-10-13 ENCOUNTER — Other Ambulatory Visit (HOSPITAL_COMMUNITY)
Admission: RE | Admit: 2021-10-13 | Discharge: 2021-10-13 | Disposition: A | Discharge status: Home / Self Care | Payer: No Typology Code available for payment source | Source: Ambulatory Visit | Attending: Nurse Practitioner | Admitting: Nurse Practitioner

## 2021-10-13 VITALS — BP 102/60 | HR 97 | Temp 97.0°F | Ht 65.5 in | Wt 119.0 lb

## 2021-10-13 DIAGNOSIS — Z23 Encounter for immunization: Secondary | ICD-10-CM

## 2021-10-13 DIAGNOSIS — Z3044 Encounter for surveillance of vaginal ring hormonal contraceptive device: Secondary | ICD-10-CM | POA: Diagnosis not present

## 2021-10-13 DIAGNOSIS — Z113 Encounter for screening for infections with a predominantly sexual mode of transmission: Secondary | ICD-10-CM | POA: Insufficient documentation

## 2021-10-13 DIAGNOSIS — Z7689 Persons encountering health services in other specified circumstances: Secondary | ICD-10-CM

## 2021-10-13 MED ORDER — ETONOGESTREL-ETHINYL ESTRADIOL 0.12-0.015 MG/24HR VA RING: 1.0000 | VAGINAL_RING | VAGINAL | 3 refills | Status: DC

## 2021-10-13 NOTE — Patient Instructions (Signed)
Thank you for choosing [facility]  Go to lab for blood draw and urine collection  Sign medical release form to get your records from [facility] department

## 2021-10-13 NOTE — Progress Notes (Signed)
Subjective:  Patient ID: Lindsay Rosario, female    DOB: Feb 01, 2001  Age: 20 y.o. MRN: [id number]  CC: Establish Care (New patient/Pt would like STD screening. )  HPI Lindsay Rosario is here to establish care, get STD screen and contraception refill. She lives with her female partner. She has had 2sex partners in her lifetime. She has completed the Gardasil vaccine-3doses. Current use of nurvaring for contraception. Previous use of depoprovera injection. This was discontinued due to menorrhagia. Hx of chlamydia infection with previous partner. Today she denies any urinary of vaginal symptoms. Last PAP completed 12/2020 by [facility] on [address]. This was normal per Lindsay Rosario.  Reviewed past Medical, Social and Family history today.  Outpatient Medications Prior to Visit  Medication Sig Dispense Refill   fluticasone (FLONASE) 50 MCG/ACT nasal spray Place 2 sprays into both nostrils daily. 16 g 6   etonogestrel-ethinyl estradiol (NUVARING) 0.12-0.015 MG/24HR vaginal ring Place vaginally.     amoxicillin-clavulanate (AUGMENTIN) 875-125 MG tablet Take 1 tablet by mouth 2 (two) times daily. (Patient not taking: Reported on 10/13/2021) 20 tablet 0   No facility-administered medications prior to visit.    ROS See HPI  Objective:  BP 102/60 (BP Location: Left Arm, Patient Position: Sitting, Cuff Size: Normal)   Pulse 97   Temp (!) 97 F (36.1 C) (Temporal)   Ht 5' 5.5" (1.664 m)   Wt 119 lb (54 kg)   LMP 10/03/2021 (Exact Date)   SpO2 98%   BMI 19.50 kg/m   Physical Exam Constitutional:      General: She is not in acute distress. Cardiovascular:     Rate and Rhythm: Normal rate.     Pulses: Normal pulses.  Pulmonary:     Effort: Pulmonary effort is normal.  Neurological:     Mental Status: She is alert and oriented to person, place, and time.  Psychiatric:        Mood and Affect: Mood normal.        Behavior: Behavior normal.        Thought Content: Thought  content normal.    Assessment & Plan:  This visit occurred during the SARS-CoV-2 public health emergency.  Safety protocols were in place, including screening questions prior to the visit, additional usage of staff PPE, and extensive cleaning of exam room while observing appropriate contact time as indicated for disinfecting solutions.   Lindsay Rosario was seen today for establish care.  Diagnoses and all orders for this visit:  Establishing care with new doctor, encounter for  Need for diphtheria-tetanus-pertussis (Tdap) vaccine -     Cancel: Tdap vaccine greater than or equal to 7yo IM  Screen for STD (sexually transmitted disease) -     HIV antibody (with reflex) -     RPR -     Urine cytology ancillary only([place]) -     Hepatitis C Antibody  Encounter for surveillance of vaginal ring hormonal contraceptive device -     etonogestrel-ethinyl estradiol (NUVARING) 0.12-0.015 MG/24HR vaginal ring; Place 1 each vaginally every 28 (twenty-eight) days.  Problem List Items Addressed This Visit       Other   Encounter for surveillance of vaginal ring hormonal contraceptive device   Relevant Medications   etonogestrel-ethinyl estradiol (NUVARING) 0.12-0.015 MG/24HR vaginal ring   Other Visit Diagnoses     Establishing care with new doctor, encounter for    -  Primary   Need for diphtheria-tetanus-pertussis (Tdap) vaccine  Screen for STD (sexually transmitted disease)       Relevant Orders   HIV antibody (with reflex)   RPR   Urine cytology ancillary only([place])   Hepatitis C Antibody       Follow-up: Return in about 6 months (around 04/13/2022) for CPE (fasting).  Alysia Penna, NP

## 2021-10-14 LAB — URINE CYTOLOGY ANCILLARY ONLY
Chlamydia: NEGATIVE
Comment: NEGATIVE
Comment: NEGATIVE
Comment: NORMAL
Neisseria Gonorrhea: NEGATIVE
Trichomonas: NEGATIVE

## 2021-10-14 LAB — HEPATITIS C ANTIBODY
Hepatitis C Ab: NONREACTIVE
SIGNAL TO CUT-OFF: 0.02 (ref <1.00)

## 2021-10-14 LAB — HIV ANTIBODY (ROUTINE TESTING W REFLEX): HIV 1&2 Ab, 4th Generation: NONREACTIVE

## 2021-10-14 LAB — RPR: RPR Ser Ql: NONREACTIVE

## 2021-11-13 ENCOUNTER — Ambulatory Visit
Admission: EM | Admit: 2021-11-13 | Discharge: 2021-11-13 | Disposition: A | Discharge status: Home / Self Care | Payer: No Typology Code available for payment source | Attending: Physician Assistant | Admitting: Physician Assistant

## 2021-11-13 ENCOUNTER — Other Ambulatory Visit: Payer: Self-pay

## 2021-11-13 ENCOUNTER — Encounter: Payer: Self-pay | Admitting: Emergency Medicine

## 2021-11-13 DIAGNOSIS — R519 Headache, unspecified: Secondary | ICD-10-CM | POA: Diagnosis not present

## 2021-11-13 MED ORDER — KETOROLAC TROMETHAMINE 30 MG/ML IJ SOLN
30.0000 mg | Freq: Once | INTRAMUSCULAR | Status: AC
  Administered 2021-11-13: 30 mg via INTRAMUSCULAR

## 2021-11-13 NOTE — ED Triage Notes (Signed)
Posterior headache on-going x 1 week. Reports it occasionally being excruciating, but that there's a general discomfort in the area at all times. Report that it occasionally makes her nauseous. Denies any visual changes or loss of consciousness with this

## 2021-11-13 NOTE — ED Provider Notes (Signed)
[name]    CSN: [id number] Arrival date & time: 11/13/21  1836      History   Chief Complaint Chief Complaint  Patient presents with   Headache    HPI Lindsay Rosario is a 21 y.o. female.   Patient here today for evaluation of right sided occipital headache that has been present for the last week. She reports ibuprofen will help but dull ache will still remain. She has had some nausea at times. She has not had any vision changes, numbness or tingling.   The history is provided by the patient.  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 No pertinent surgical history.  OB History   No obstetric history on file.      Home Medications    Prior to Admission medications   Medication Sig Start Date End Date Taking? Authorizing Provider  etonogestrel-ethinyl estradiol (NUVARING) 0.12-0.015 MG/24HR vaginal ring Place 1 each vaginally every 28 (twenty-eight) days. 10/13/21   Nche, Charlene Brooke, NP  fluticasone (FLONASE) 50 MCG/ACT nasal spray Place 2 sprays into both nostrils daily. 09/11/21   Dorothyann Peng, NP    Family History History reviewed. No pertinent family history.  Social History Social History   Tobacco Use   Smoking status: Never   Smokeless tobacco: Never  Substance Use Topics   Alcohol use: No   Drug use: Never     Allergies   Kiwi extract   Review of Systems Review of Systems  Constitutional:  Negative for chills and fever.  Eyes:  Negative for photophobia, discharge, redness and visual disturbance.  Respiratory:  Negative for  shortness of breath.   Gastrointestinal:  Positive for nausea. Negative for abdominal pain and vomiting.  Neurological:  Positive for headaches. Negative for weakness and numbness.    Physical Exam Triage Vital Signs ED Triage Vitals  Enc Vitals Group     BP 11/13/21 1850 (!) 132/92     Pulse Rate 11/13/21 1850 94     Resp 11/13/21 1850 16     Temp 11/13/21 1850 98.3 F (36.8 C)     Temp Source 11/13/21 1850 Oral     SpO2 11/13/21 1850 96 %     Weight --      Height --      Head Circumference --      Peak Flow --      Pain Score 11/13/21 1851 3     Pain Loc --      Pain Edu? --      Excl. in [place]? --    No data found.  Updated Vital Signs BP (!) 132/92 (BP Location: Left Arm)    Pulse 94    Temp 98.3 F (36.8 C) (Oral)    Resp 16    SpO2 96%      Physical Exam Vitals and nursing note reviewed.  Constitutional:      General: She is not in acute distress.    Appearance: Normal appearance. She is not ill-appearing.  HENT:     Head: Normocephalic and atraumatic.  Eyes:  Extraocular Movements: Extraocular movements intact.     Conjunctiva/sclera: Conjunctivae normal.     Pupils: Pupils are equal, round, and reactive to light.  Cardiovascular:     Rate and Rhythm: Normal rate.  Pulmonary:     Effort: Pulmonary effort is normal.  Neurological:     Mental Status: She is alert.     Cranial Nerves: No facial asymmetry.  Psychiatric:        Mood and Affect: Mood normal.        Behavior: Behavior normal.        Thought Content: Thought content normal.     UC Treatments / Results  Labs (all labs ordered are listed, but only abnormal results are displayed) Labs Reviewed - No data to display  EKG   Radiology No results found.  Procedures Procedures (including critical care time)  Medications Ordered in UC Medications  ketorolac (TORADOL) 30 MG/ML injection 30 mg (has no administration in time range)    Initial Impression / Assessment and Plan / UC Course   I have reviewed the triage vital signs and the nursing notes.  Pertinent labs & imaging results that were available during my care of the patient were reviewed by me and considered in my medical decision making (see chart for details).   Will treat with Toradol injection to hopefully break headache cycle. Recommended she report to ED if no improvement or if symptoms worsen. Patient expresses understanding.   Final Clinical Impressions(s) / UC Diagnoses   Final diagnoses:  Nonintractable headache, unspecified chronicity pattern, unspecified headache type   Discharge Instructions   None    ED Prescriptions   None    PDMP not reviewed this encounter.   Francene Finders, PA-C 11/13/21 1911

## 2021-11-17 ENCOUNTER — Telehealth: Payer: No Typology Code available for payment source | Admitting: Physician Assistant

## 2021-11-17 DIAGNOSIS — A084 Viral intestinal infection, unspecified: Secondary | ICD-10-CM

## 2021-11-17 MED ORDER — ONDANSETRON HCL 4 MG PO TABS: 4.0000 mg | ORAL_TABLET | Freq: Three times a day (TID) | ORAL | 0 refills | Status: DC | PRN

## 2021-11-17 NOTE — Progress Notes (Signed)
We are sorry that you are not feeling well.  Here is how we plan to help!  Based on what you have shared with me it looks like you have Acute Infectious Diarrhea.  Most cases of acute diarrhea are due to infections with virus and bacteria and are self-limited conditions lasting less than 14 days.  For your symptoms you may take Imodium 2 mg tablets that are over the counter at your local pharmacy. Take two tablet now and then one after each loose stool up to 6 a day.  Antibiotics are not needed for most people with diarrhea.  Optional: Zofran 4 mg 1 tablet every 8 hours as needed for nausea and vomiting  A work note will be provided as well. It will be under "letters" in your MyChart.  HOME CARE We recommend changing your diet to help with your symptoms for the next few days. Drink plenty of fluids that contain water salt and sugar. Sports drinks such as Gatorade may help.  You may try broths, soups, bananas, applesauce, soft breads, mashed potatoes or crackers.  You are considered infectious for as long as the diarrhea continues. Hand washing or use of alcohol based hand sanitizers is recommend. It is best to stay out of work or school until your symptoms stop.   GET HELP RIGHT AWAY If you have dark yellow colored urine or do not pass urine frequently you should drink more fluids.   If your symptoms worsen  If you feel like you are going to pass out (faint) You have a new problem  MAKE SURE YOU  Understand these instructions. Will watch your condition. Will get help right away if you are not doing well or get worse.  Thank you for choosing an e-visit.  Your e-visit answers were reviewed by a board certified advanced clinical practitioner to complete your personal care plan. Depending upon the condition, your plan could have included both over the counter or prescription medications.  Please review your pharmacy choice. Make sure the pharmacy is open so you can pick up prescription  now. If there is a problem, you may contact your provider through [employer] and have the prescription routed to another pharmacy.  Your safety is important to [place]. If you have drug allergies check your prescription carefully.   For the next 24 hours you can use MyChart to ask questions about today's visit, request a non-urgent call back, or ask for a work or school excuse. You will get an email in the next two days asking about your experience. I hope that your e-visit has been valuable and will speed your recovery.  I provided 5 minutes of non face-to-face time during this encounter for chart review and documentation.

## 2021-11-27 ENCOUNTER — Ambulatory Visit (INDEPENDENT_AMBULATORY_CARE_PROVIDER_SITE_OTHER): Payer: No Typology Code available for payment source | Admitting: Nurse Practitioner

## 2021-11-27 ENCOUNTER — Other Ambulatory Visit: Payer: Self-pay

## 2021-11-27 ENCOUNTER — Encounter: Payer: Self-pay | Admitting: Nurse Practitioner

## 2021-11-27 VITALS — BP 110/64 | HR 95 | Temp 97.8°F | Resp 18 | Wt 122.8 lb

## 2021-11-27 DIAGNOSIS — Z820 Family history of epilepsy and other diseases of the nervous system: Secondary | ICD-10-CM

## 2021-11-27 DIAGNOSIS — G43009 Migraine without aura, not intractable, without status migrainosus: Secondary | ICD-10-CM | POA: Diagnosis not present

## 2021-11-27 DIAGNOSIS — Z8669 Personal history of other diseases of the nervous system and sense organs: Secondary | ICD-10-CM

## 2021-11-27 MED ORDER — BUTALBITAL-APAP-CAFFEINE 50-325-40 MG PO TABS: 1.0000 | ORAL_TABLET | ORAL | 0 refills | Status: DC | PRN

## 2021-11-27 NOTE — Assessment & Plan Note (Addendum)
Hx of grand mal seizure Previous use of lamictal, she was wean off medication under ped. Neurology's guidance. Seizure free since 2020 per patient. With lingering headache in last 2weeks, she is concerned about possible recurrence of seizure. Entered neurology referral

## 2021-11-27 NOTE — Patient Instructions (Signed)
Call office if no improvement in 3days.  Migraine Headache A migraine headache is a very strong throbbing pain on one side or both sides of your head. This type of headache can also cause other symptoms. It can last from 4 hours to 3 days. Talk with your doctor about what things may bring on (trigger) this condition. What are the causes? The exact cause of this condition is not known. This condition may be triggered or caused by: Drinking alcohol. Smoking. Taking medicines, such as: Medicine used to treat chest pain (nitroglycerin). Birth control pills. Estrogen. Some blood pressure medicines. Eating or drinking certain products. Doing physical activity. Other things that may trigger a migraine headache include: Having a menstrual period. Pregnancy. Hunger. Stress. Not getting enough sleep or getting too much sleep. Weather changes. Tiredness (fatigue). What increases the risk? Being 40-52 years old. Being female. Having a family history of migraine headaches. Being Caucasian. Having depression or anxiety. Being very overweight. What are the signs or symptoms? A throbbing pain. This pain may: Happen in any area of the head, such as on one side or both sides. Make it hard to do daily activities. Get worse with physical activity. Get worse around bright lights or loud noises. Other symptoms may include: Feeling sick to your stomach (nauseous). Vomiting. Dizziness. Being sensitive to bright lights, loud noises, or smells. Before you get a migraine headache, you may get warning signs (an aura). An aura may include: Seeing flashing lights or having blind spots. Seeing bright spots, halos, or zigzag lines. Having tunnel vision or blurred vision. Having numbness or a tingling feeling. Having trouble talking. Having weak muscles. Some people have symptoms after a migraine headache (postdromal phase), such as: Tiredness. Trouble thinking (concentrating). How is this  treated? Taking medicines that: Relieve pain. Relieve the feeling of being sick to your stomach. Prevent migraine headaches. Treatment may also include: Having acupuncture. Avoiding foods that bring on migraine headaches. Learning ways to control your body functions (biofeedback). Therapy to help you know and deal with negative thoughts (cognitive behavioral therapy). Follow these instructions at home: Medicines Take over-the-counter and prescription medicines only as told by your doctor. Ask your doctor if the medicine prescribed to you: Requires you to avoid driving or using heavy machinery. Can cause trouble pooping (constipation). You may need to take these steps to prevent or treat trouble pooping: Drink enough fluid to keep your pee (urine) pale yellow. Take over-the-counter or prescription medicines. Eat foods that are high in fiber. These include beans, whole grains, and fresh fruits and vegetables. Limit foods that are high in fat and sugar. These include fried or sweet foods. Lifestyle Do not drink alcohol. Do not use any products that contain nicotine or tobacco, such as cigarettes, e-cigarettes, and chewing tobacco. If you need help quitting, ask your doctor. Get at least 8 hours of sleep every night. Limit and deal with stress. General instructions   Keep a journal to find out what may bring on your migraine headaches. For example, write down: What you eat and drink. How much sleep you get. Any change in what you eat or drink. Any change in your medicines. If you have a migraine headache: Avoid things that make your symptoms worse, such as bright lights. It may help to lie down in a dark, quiet room. Do not drive or use heavy machinery. Ask your doctor what activities are safe for you. Keep all follow-up visits as told by your doctor. This is important. Contact a  doctor if: You get a migraine headache that is different or worse than others you have had. You have  more than 15 headache days in one month. Get help right away if: Your migraine headache gets very bad. Your migraine headache lasts longer than 72 hours. You have a fever. You have a stiff neck. You have trouble seeing. Your muscles feel weak or like you cannot control them. You start to lose your balance a lot. You start to have trouble walking. You pass out (faint). You have a seizure. Summary A migraine headache is a very strong throbbing pain on one side or both sides of your head. These headaches can also cause other symptoms. This condition may be treated with medicines and changes to your lifestyle. Keep a journal to find out what may bring on your migraine headaches. Contact a doctor if you get a migraine headache that is different or worse than others you have had. Contact your doctor if you have more than 15 headache days in a month. This information is not intended to replace advice given to you by your health care provider. Make sure you discuss any questions you have with your health care provider. Document Revised: 02/10/2019 Document Reviewed: 12/01/2018 Elsevier Patient Education  2022 [employer].

## 2021-11-27 NOTE — Progress Notes (Signed)
Subjective:  Patient ID: Lindsay Rosario, female    DOB: [birth year]  Age: 21 y.o. MRN: [id number]  CC: Headache ( Pt would like referral to neurologist for headache, pt states it started the 11/09/2021 and has been consistent, in the back of the head, Bp has been elevated. Urgent care visit 11/13/2021, sharp pains all over every so often hx of seizures.pt is taking tylenol extra strengthen it only dulls the pian. )  Headache  This is a recurrent problem. The current episode started 1 to 4 weeks ago. The problem occurs constantly. The problem has been waxing and waning. The pain is located in the Bilateral region. The pain radiates to the left neck. The pain quality is not similar to prior headaches. The quality of the pain is described as throbbing. Associated symptoms include nausea. Pertinent negatives include no abdominal pain, abnormal behavior, anorexia, back pain, blurred vision, coughing, dizziness, drainage, facial sweating, fever, hearing loss, insomnia, loss of balance, muscle aches, neck pain, scalp tenderness, seizures, sinus pressure, sore throat, swollen glands, tingling, tinnitus or visual change. Nothing aggravates the symptoms. She has tried acetaminophen for the symptoms. The treatment provided no relief. Her past medical history is significant for migraines in the family. There is no history of hypertension, recent head traumas or sinus disease.  Fhx of migraines: mother and brother.  History of seizure disorder Hx of grand mal seizure Previous use of lamictal, she was wean off medication under ped. Neurology's guidance. Seizure free since 2020 per patient. With lingering headache in last 2weeks, she is concerned about possible recurrence of seizure. Entered neurology referral  Wt Readings from Last 3 Encounters:  11/27/21 122 lb 12.8 oz (55.7 kg)  10/13/21 119 lb (54 kg)  09/11/21 120 lb (54.4 kg)    BP Readings from Last 3 Encounters:  11/27/21 110/64  11/13/21 (!)  132/92  10/13/21 102/60    Reviewed past Medical, Social and Family history today.  Outpatient Medications Prior to Visit  Medication Sig Dispense Refill   etonogestrel-ethinyl estradiol (NUVARING) 0.12-0.015 MG/24HR vaginal ring Place 1 each vaginally every 28 (twenty-eight) days. 3 each 3   fluticasone (FLONASE) 50 MCG/ACT nasal spray Place 2 sprays into both nostrils daily. 16 g 6   ondansetron (ZOFRAN) 4 MG tablet Take 1 tablet (4 mg total) by mouth every 8 (eight) hours as needed for nausea or vomiting. (Patient not taking: Reported on 11/27/2021) 20 tablet 0   No facility-administered medications prior to visit.    ROS See HPI  Objective:  BP 110/64 (BP Location: Left Arm, Patient Position: Sitting, Cuff Size: Normal)    Pulse 95    Temp 97.8 F (36.6 C) (Temporal)    Resp 18    Wt 122 lb 12.8 oz (55.7 kg)    SpO2 98%    BMI 20.12 kg/m   Physical Exam Vitals and nursing note reviewed.  Eyes:     Extraocular Movements: Extraocular movements intact.     Pupils: Pupils are equal, round, and reactive to light.  Cardiovascular:     Rate and Rhythm: Normal rate.  Pulmonary:     Effort: Pulmonary effort is normal.  Abdominal:     Palpations: Abdomen is soft.  Musculoskeletal:     Cervical back: Normal range of motion and neck supple.  Lymphadenopathy:     Cervical: No cervical adenopathy.  Neurological:     Mental Status: She is alert and oriented to person, place, and time.     Cranial  Nerves: No cranial nerve deficit, dysarthria or facial asymmetry.     Coordination: Romberg sign negative.     Gait: Gait normal.     Deep Tendon Reflexes: Reflexes normal.  Psychiatric:        Mood and Affect: Mood normal.        Speech: Speech normal.        Behavior: Behavior normal.    Assessment & Plan:  This visit occurred during the SARS-CoV-2 public health emergency.  Safety protocols were in place, including screening questions prior to the visit, additional usage of staff PPE,  and extensive cleaning of exam room while observing appropriate contact time as indicated for disinfecting solutions.   [name] was seen today for headache.  Diagnoses and all orders for this visit:  Migraine without aura and without status migrainosus, not intractable -     butalbital-acetaminophen-caffeine (FIORICET) 50-325-40 MG tablet; Take 1 tablet by mouth every 4 (four) hours as needed for headache. -     Ambulatory referral to Neurology -     CT HEAD WO CONTRAST ( ); Future  History of seizure disorder -     Ambulatory referral to Neurology  Family history of migraine headaches   Problem List Items Addressed This Visit       Other   Family history of migraine headaches   History of seizure disorder    Hx of grand mal seizure Previous use of lamictal, she was wean off medication under ped. Neurology's guidance. Seizure free since 2020 per patient. With lingering headache in last 2weeks, she is concerned about possible recurrence of seizure. Entered neurology referral       Relevant Orders   Ambulatory referral to Neurology   Other Visit Diagnoses     Migraine without aura and without status migrainosus, not intractable    -  Primary   Relevant Medications   butalbital-acetaminophen-caffeine (FIORICET) 50-325-40 MG tablet   Other Relevant Orders   Ambulatory referral to Neurology   CT HEAD WO CONTRAST ( )       Follow-up: No follow-ups on file.  Alysia Penna, NP

## 2021-12-01 ENCOUNTER — Encounter: Payer: Self-pay | Admitting: Neurology
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
## 2021-12-10 ENCOUNTER — Ambulatory Visit (INDEPENDENT_AMBULATORY_CARE_PROVIDER_SITE_OTHER): Payer: No Typology Code available for payment source | Admitting: Neurology

## 2021-12-10 ENCOUNTER — Other Ambulatory Visit: Payer: Self-pay

## 2021-12-10 ENCOUNTER — Encounter: Payer: Self-pay | Admitting: Neurology

## 2021-12-10 VITALS — BP 126/87 | HR 100 | Ht 65.0 in | Wt 124.2 lb

## 2021-12-10 DIAGNOSIS — R519 Headache, unspecified: Secondary | ICD-10-CM | POA: Diagnosis not present

## 2021-12-10 DIAGNOSIS — Z87898 Personal history of other specified conditions: Secondary | ICD-10-CM | POA: Diagnosis not present

## 2021-12-10 MED ORDER — ZONISAMIDE 25 MG PO CAPS: ORAL_CAPSULE | ORAL | 0 refills | Status: DC

## 2021-12-10 NOTE — Patient Instructions (Signed)
MRI of brain with and without contrast Start zonisamide 25mg  daily for one week, then 50mg  daily for one week, then 75mg  daily for one week, then 100mg  daily.  If headaches not improved after being on 100mg  daily for 6 weeks, contact me Follow up 4 months.

## 2021-12-15 ENCOUNTER — Ambulatory Visit: Payer: No Typology Code available for payment source | Admitting: Nurse Practitioner

## 2021-12-21 ENCOUNTER — Ambulatory Visit
Admission: RE | Admit: 2021-12-21 | Discharge: 2021-12-21 | Disposition: A | Discharge status: Home / Self Care | Payer: No Typology Code available for payment source | Source: Ambulatory Visit | Attending: Neurology | Admitting: Neurology

## 2021-12-21 ENCOUNTER — Other Ambulatory Visit: Payer: Self-pay

## 2021-12-21 DIAGNOSIS — Z87898 Personal history of other specified conditions: Secondary | ICD-10-CM

## 2021-12-21 DIAGNOSIS — R519 Headache, unspecified: Secondary | ICD-10-CM

## 2021-12-21 MED ORDER — GADOBENATE DIMEGLUMINE 529 MG/ML IV SOLN
10.0000 mL | Freq: Once | INTRAVENOUS | Status: AC | PRN
  Administered 2021-12-21: 10 mL via INTRAVENOUS

## 2021-12-26 ENCOUNTER — Other Ambulatory Visit: Payer: No Typology Code available for payment source

## 2022-01-12 ENCOUNTER — Encounter: Payer: Self-pay | Admitting: Nurse Practitioner

## 2022-01-12 ENCOUNTER — Telehealth (INDEPENDENT_AMBULATORY_CARE_PROVIDER_SITE_OTHER): Payer: No Typology Code available for payment source | Admitting: Nurse Practitioner

## 2022-01-12 DIAGNOSIS — J069 Acute upper respiratory infection, unspecified: Secondary | ICD-10-CM | POA: Diagnosis not present

## 2022-01-12 MED ORDER — PREDNISONE 10 MG PO TABS: ORAL_TABLET | ORAL | 0 refills | Status: DC

## 2022-01-12 NOTE — Patient Instructions (Signed)
It was great to see you! ? ?Your symptoms and exam findings are most consistent with a viral upper respiratory infection. These usually run their course in 5-7 days. Unfortunately, antibiotics don't work against viruses and just increase your risk of other issues such as diarrhea, yeast infections, and resistant infections.  If you start feeling worse with facial pain, high fever, cough, shortness of breath or start feeling significantly worse, please call us right away to be further evaluated.  ?Some things that can make you feel better are: ?- Increased rest ?- Increasing Fluids ?- Acetaminophen / ibuprofen as needed for fever/pain.  ?- Salt water gargling, chloraseptic spray and throat lozenges ?- OTC pseudoephedrine or coricidin if you have a history of high blood pressure or take blood pressure medications ?- Mucinex.  ?- Saline sinus flushes or a neti pot.  ?- Humidifying the air. ? ?Let's follow-up if you symptoms don't improve or worsen.  ? ?Take care, ? ?Rodman Pickle, NP ? ?

## 2022-01-12 NOTE — Progress Notes (Signed)
? PRIMARY CARE ?[facility] ?[address] ?[place] [address] ?Dept: [phone] ?Dept Fax: [phone] ? ?Virtual Video Visit ? ?I connected with Harris S Eberwein on 01/12/22 at  2:20 PM EDT by a video enabled telemedicine application and verified that I am speaking with the correct person using two identifiers. ? ?Location patient: Home ?Location provider: Clinic ?Persons participating in the virtual visit: Patient, Provider, CMA ? ?I discussed the limitations of evaluation and management by telemedicine and the availability of in person appointments. The patient expressed understanding and agreed to proceed. ? ?Chief Complaint  ?Patient presents with  ? URI  ?  Pt c/o sore throat, headache, congestion and cough x4 days  ? ? ?SUBJECTIVE: ? ?HPI: Tianni Escamilla Harr is a 21 y.o. female who presents with sore throat, headache, and congestion for 4 days. She has a covid-19 test scheduled tomorrow through health at work.  ? ?UPPER RESPIRATORY TRACT INFECTION ? ?Fever: no ?Cough: yes ?Shortness of breath: no ?Wheezing: no ?Chest pain: no ?Chest tightness: no ?Chest congestion: no ?Nasal congestion: yes ?Runny nose: yes ?Post nasal drip: yes ?Sneezing: yes ?Sore throat: yes ?Swollen glands: no ?Sinus pressure: yes ?Headache: yes ?Face pain: no ?Toothache: no ?Ear pain: no bilateral ?Ear pressure: yes bilateral ?Eyes red/itching:no ?Eye drainage/crusting: no  ?Vomiting: no ?Rash: no ?Fatigue: yes ?Sick contacts: yes cousin, aunt ?Strep contacts: no  ?Context: worse ?Recurrent sinusitis: no ?Relief with OTC cold/cough medications: yes  ?Treatments attempted: flonase, ibuprofen, increased fluids  ? ? ?Patient Active Problem List  ? Diagnosis Date Noted  ? Family history of migraine headaches 11/27/2021  ? Encounter for surveillance of vaginal ring hormonal contraceptive device 10/13/2021  ? History of seizure disorder 07/22/2016  ? ? ?History reviewed. No pertinent surgical  history. ? ?Family History  ?Problem Relation Age of Onset  ? Migraines Mother   ? Migraines Brother   ? ? ?Social History  ? ?Tobacco Use  ? Smoking status: Never  ? Smokeless tobacco: Never  ?Substance Use Topics  ? Alcohol use: No  ? Drug use: Never  ? ? ? ?Current Outpatient Medications:  ?  etonogestrel-ethinyl estradiol (NUVARING) 0.12-0.015 MG/24HR vaginal ring, Place 1 each vaginally every 28 (twenty-eight) days., Disp: 3 each, Rfl: 3 ?  predniSONE (DELTASONE) 10 MG tablet, Take 6 tablets today, then 5 tablets tomorrow, then decrease by 1 tablet every day until gone, Disp: 21 tablet, Rfl: 0 ?  zonisamide (ZONEGRAN) 25 MG capsule, Take 1 capsule daily for one week, then 2 capsules daily for one week, then 3 capsules daily for one week, then 4 capsules daily, Disp: 120 capsule, Rfl: 0 ?  butalbital-acetaminophen-caffeine (FIORICET) 50-325-40 MG tablet, Take 1 tablet by mouth every 4 (four) hours as needed for headache., Disp: 21 tablet, Rfl: 0 ?  fluticasone (FLONASE) 50 MCG/ACT nasal spray, Place 2 sprays into both nostrils daily., Disp: 16 g, Rfl: 6 ?  ondansetron (ZOFRAN) 4 MG tablet, Take 1 tablet (4 mg total) by mouth every 8 (eight) hours as needed for nausea or vomiting. (Patient not taking: Reported on 01/12/2022), Disp: 20 tablet, Rfl: 0 ? ?Allergies  ?Allergen Reactions  ? Kiwi Extract   ?  Lip goes numb   ? ? ?ROS: See pertinent positives and negatives per HPI. ? ?OBSERVATIONS/OBJECTIVE: ? ?VITALS per patient if applicable: ?There were no vitals filed for this visit. ?There is no height or weight on file to calculate BMI. ?  ? ?GENERAL: Alert and oriented. Appears well  and in no acute distress. ? ?HEENT: Atraumatic. Conjunctiva clear. No obvious abnormalities on inspection of external nose and ears. ? ?NECK: Normal movements of the head and neck. ? ?LUNGS: On inspection, no signs of respiratory distress. Breathing rate appears normal. No obvious gross SOB, gasping or wheezing, and no conversational  dyspnea. ? ?CV: No obvious cyanosis. ? ?MS: Moves all visible extremities without noticeable abnormality. ? ?PSYCH/NEURO: Pleasant and cooperative. No obvious depression or anxiety. Speech and thought processing grossly intact. ? ?ASSESSMENT AND PLAN: ? ?Problem List Items Addressed This Visit   ?None ?Visit Diagnoses   ? ? Upper respiratory tract infection, unspecified type    -  Primary  ? Most likely viral. Has covid-19 test scheduled for tomorrow. Start prednisone taper. Continue flonase, fluids, and rest. Work note given. F/U if not improving.   ? ?  ? ?  ?I discussed the assessment and treatment plan with the patient. The patient was provided an opportunity to ask questions and all were answered. The patient agreed with the plan and demonstrated an understanding of the instructions. ?  ?The patient was advised to call back or seek an in-person evaluation if the symptoms worsen or if the condition fails to improve as anticipated. ? ?Time spent on call:  7 minutes with patient face to face via video conference. More than 50% of this time was spent in counseling and coordination of care. 5 minutes total spent in review of patient's record and preparation of their chart. ? ? ?Gerre Scull, NP  ?

## 2022-02-13 ENCOUNTER — Other Ambulatory Visit: Payer: Self-pay | Admitting: Neurology

## 2022-02-15 ENCOUNTER — Other Ambulatory Visit: Payer: Self-pay | Admitting: Neurology

## 2022-02-16 ENCOUNTER — Other Ambulatory Visit: Payer: Self-pay | Admitting: Neurology

## 2022-02-16 ENCOUNTER — Telehealth: Payer: Self-pay | Admitting: Neurology

## 2022-02-16 MED ORDER — ZONISAMIDE 100 MG PO CAPS: 100.0000 mg | ORAL_CAPSULE | Freq: Every day | ORAL | 5 refills | Status: DC

## 2022-02-16 NOTE — Telephone Encounter (Signed)
Pt called in and left a message with the access nurse on 02/13/22. She needs to know the side effects of stopping Zonisamide. She has no refills and it's the weekend. Please see the full access nurse report in Dr. Moises Blood box. ?

## 2022-02-17 NOTE — Telephone Encounter (Signed)
Patient advised.

## 2022-02-20 ENCOUNTER — Telehealth: Payer: Self-pay | Admitting: Nurse Practitioner

## 2022-02-20 NOTE — Telephone Encounter (Signed)
Pt is wanting to transfer her care from [facility] to Dr. [name]. Pt works there and it's much easier for her to be seen. Just let me know and I'll call her back. [phone] ?

## 2022-02-20 NOTE — Telephone Encounter (Signed)
Okay with Dr. Jordan 

## 2022-02-23 NOTE — Telephone Encounter (Signed)
I have left pt 2 vm letting her know she can transfer her care to Dr. Swaziland. ?
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
## 2022-04-13 ENCOUNTER — Encounter: Payer: No Typology Code available for payment source | Admitting: Nurse Practitioner

## 2022-04-13 ENCOUNTER — Encounter: Payer: No Typology Code available for payment source | Admitting: Family Medicine
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
## 2022-04-14 ENCOUNTER — Ambulatory Visit (INDEPENDENT_AMBULATORY_CARE_PROVIDER_SITE_OTHER): Payer: No Typology Code available for payment source | Admitting: Family Medicine

## 2022-04-14 ENCOUNTER — Encounter: Payer: Self-pay | Admitting: Family Medicine

## 2022-04-14 ENCOUNTER — Ambulatory Visit (INDEPENDENT_AMBULATORY_CARE_PROVIDER_SITE_OTHER): Payer: No Typology Code available for payment source | Admitting: Neurology

## 2022-04-14 ENCOUNTER — Encounter: Payer: Self-pay | Admitting: Neurology

## 2022-04-14 VITALS — BP 110/60 | HR 100 | Resp 12 | Ht 64.0 in | Wt 116.8 lb

## 2022-04-14 VITALS — BP 119/81 | HR 102 | Ht 64.0 in | Wt 116.8 lb

## 2022-04-14 DIAGNOSIS — G44219 Episodic tension-type headache, not intractable: Secondary | ICD-10-CM | POA: Diagnosis not present

## 2022-04-14 DIAGNOSIS — R519 Headache, unspecified: Secondary | ICD-10-CM | POA: Diagnosis not present

## 2022-04-14 DIAGNOSIS — Z87898 Personal history of other specified conditions: Secondary | ICD-10-CM

## 2022-04-14 DIAGNOSIS — L309 Dermatitis, unspecified: Secondary | ICD-10-CM

## 2022-04-14 DIAGNOSIS — G4700 Insomnia, unspecified: Secondary | ICD-10-CM

## 2022-04-14 DIAGNOSIS — G40309 Generalized idiopathic epilepsy and epileptic syndromes, not intractable, without status epilepticus: Secondary | ICD-10-CM | POA: Diagnosis not present

## 2022-04-14 DIAGNOSIS — F332 Major depressive disorder, recurrent severe without psychotic features: Secondary | ICD-10-CM

## 2022-04-14 DIAGNOSIS — F411 Generalized anxiety disorder: Secondary | ICD-10-CM | POA: Insufficient documentation

## 2022-04-14 HISTORY — DX: Major depressive disorder, recurrent severe without psychotic features: F33.2

## 2022-04-14 HISTORY — DX: Generalized anxiety disorder: F41.1

## 2022-04-14 MED ORDER — SERTRALINE HCL 25 MG PO TABS: 25.0000 mg | ORAL_TABLET | Freq: Every day | ORAL | 3 refills | Status: DC

## 2022-04-14 MED ORDER — TRIAMCINOLONE ACETONIDE 0.1 % EX CREA: 1.0000 "application " | TOPICAL_CREAM | Freq: Two times a day (BID) | CUTANEOUS | 1 refills | Status: DC | PRN

## 2022-04-14 NOTE — Assessment & Plan Note (Signed)
We discussed possible etiologies, included other psychotic disorders like bipolar. Melatonin has helped in the past, recommend resuming at a dose of 5 mg, she can titrate up to 15 mg 2 hours before bedtime with empty stomach. Stressed the importance of adequate sleep hygiene.

## 2022-04-14 NOTE — Assessment & Plan Note (Signed)
Severe (GAD-7 score 17). Sertraline 25 mg daily started today. Pending appointment to start CBT. F/U in 4-5 weeks.

## 2022-04-14 NOTE — Assessment & Plan Note (Signed)
She has not had an episode since age 21. She is established with neurologist, Dr Charlene Brooke, for headache management.

## 2022-04-14 NOTE — Assessment & Plan Note (Signed)
After discussion of son pharmacologic options, she agrees with trying low-dose sertraline. Sertraline 25 mg daily started today. We discussed some side effects, including the risk of suicidal ideation. Pending appointment with psychotherapist. Instructed about warning signs. Follow-up in 4 to 5 weeks, before if needed.

## 2022-04-14 NOTE — Patient Instructions (Addendum)
A few things to remember from today's visit:   Dermatitis - Plan: triamcinolone cream (KENALOG) 0.1 %  Insomnia, unspecified type  Moderately severe recurrent major depression (HCC) - Plan: sertraline (ZOLOFT) 25 MG tablet  If you need refills please call your pharmacy. Do not use My Chart to request refills or for acute issues that need immediate attention.   Try changing back to old deodorant. Small amount of cream 2 times daily for 14 days then as needed.  Please be sure medication list is accurate.  If a new problem present, please set up appointment sooner than planned today. Today we started Sertraline, this type of medications can increase suicidal risk. This is more prevalent among children,adolecents, and young adults with major depression or other psychiatric disorders. It can also make depression worse. Most common side effects are gastrointestinal, self limited after a few weeks: diarrhea, nausea, constipation  Or diarrhea among some.  In general it is well tolerated. We will follow closely.  Melatonin from 5-15 mg 2 hours before bedtime. D

## 2022-04-15 ENCOUNTER — Telehealth: Payer: Self-pay | Admitting: Pharmacy Technician

## 2022-04-15 NOTE — Telephone Encounter (Signed)
Canceling PA. Thanks!

## 2022-04-15 NOTE — Telephone Encounter (Signed)
Initiated Botox PA- Plan requires patient has trial of  TWO of the following prophylactic migraine treatments: valproic acid/divalproex sodium, topiramate, propranolol, timolol, amitriptyline, venlafaxine, atenolol, nadolol, metoprolol.  Before they will approve Botox therapy. Was unable to find any info in patient's chart.  Please advise.  Key: [id number] - PA Case ID: [id number]

## 2022-04-15 NOTE — Telephone Encounter (Signed)
-----   Message from Leida Lauth, [place] sent at 04/14/2022 10:22 AM EDT ----- Regarding: NEW Start BOTOX PA Good Morning Team, Please start a PA for this patient for Botox. Need to have her scheduled for July botox day.  Thank you

## 2022-05-18 NOTE — Progress Notes (Signed)
HPI: Lindsay Rosario is a 21 y.o. female, who is here today to follow on recent visit. She was seen on 04/14/2022 when she was concerned about insomnia. History of anxiety and depression, we started sertraline 25 mg daily. She has tolerated medication well, she has noted improvement in some of her symptoms. Motivation has improved as well as sleep. Sleeping about 7 to 8 hours.  Today she is concerned about weight loss. She acknowledges she is not eating as much for the past few weeks, decreased appetite. Her wt has been 119-123 Lb in average.  Negative for fever, night sweats, and myalgias.  Also reporting easy bruising with minimal or no trauma, which has been going on for years but seems to be getting worse. She thinks she may have had seen blood in the stool or about a month ago x1. Abdominal cramps, exacerbated by daily problems, which has been going on for years. Diarrhea for about 40 months.  She does not have daily bowel movements but when she does it is usually loose stools, up to 4 per day.  No known FHx of IBD. Negative for nosebleeds, gum bleeds, melena, or gross hematuria. Last menstrual period was heavier with some blood clots.  Review of Systems  Constitutional:  Positive for appetite change. Negative for activity change and chills.  HENT:  Negative for facial swelling and sore throat.   Respiratory:  Negative for cough, shortness of breath and wheezing.   Gastrointestinal:  Negative for rectal pain.       Negative for changes in bowel habits.  Endocrine: Negative for cold intolerance and heat intolerance.  Genitourinary:  Negative for decreased urine volume, dysuria and frequency.  Skin:  Negative for pallor and rash.  Neurological:  Negative for syncope.  Hematological:  Negative for adenopathy.  Psychiatric/Behavioral:  Negative for confusion. The patient is nervous/anxious.   Rest see pertinent positives and negatives per HPI.  Current Outpatient Medications  on File Prior to Visit  Medication Sig Dispense Refill   etonogestrel-ethinyl estradiol (NUVARING) 0.12-0.015 MG/24HR vaginal ring Place 1 each vaginally every 28 (twenty-eight) days. 3 each 3   fluticasone (FLONASE) 50 MCG/ACT nasal spray Place 2 sprays into both nostrils daily. 16 g 6   sertraline (ZOLOFT) 25 MG tablet Take 1 tablet (25 mg total) by mouth daily. 30 tablet 3   triamcinolone cream (KENALOG) 0.1 % Apply 1 application  topically 2 (two) times daily as needed. 30 g 1   zonisamide (ZONEGRAN) 100 MG capsule Take 1 capsule (100 mg total) by mouth daily. 30 capsule 5   No current facility-administered medications on file prior to visit.   Past Medical History:  Diagnosis Date   GAD (generalized anxiety disorder) 04/14/2022   Headache(784.0)    Moderately severe recurrent major depression (HCC) 04/14/2022   Seizures (HCC)    Allergies  Allergen Reactions   Kiwi Extract     Lip goes numb    Social History   Socioeconomic History   Marital status: Significant Other    Spouse name: Not on file   Number of children: Not on file   Years of education: Not on file   Highest education level: Not on file  Occupational History   Not on file  Tobacco Use   Smoking status: Never   Smokeless tobacco: Never  Substance and Sexual Activity   Alcohol use: No   Drug use: Never   Sexual activity: Yes    Birth control/protection: Inserts  Other Topics Concern  Not on file  Social History Narrative   Joelee is a recent graduate from [employer]; she attends [employer] and majors in Radiology. She does well in school.   Lives with her grandparents   Social Determinants of Health   Financial Resource Strain: Not on file  Food Insecurity: Not on file  Transportation Needs: Not on file  Physical Activity: Not on file  Stress: Not on file  Social Connections: Not on file   Vitals:   05/19/22 0734  BP: 100/70  Pulse: 95  Resp: 12  SpO2: 98%   Wt Readings from Last 3  Encounters:  05/19/22 114 lb 6 oz (51.9 kg)  04/14/22 116 lb 12.8 oz (53 kg)  04/14/22 116 lb 12.8 oz (53 kg)  Body mass index is 19.63 kg/m.  Physical Exam Vitals and nursing note reviewed.  Constitutional:      General: She is not in acute distress.    Appearance: She is well-developed.  HENT:     Head: Normocephalic and atraumatic.     Mouth/Throat:     Mouth: Mucous membranes are moist.     Pharynx: Oropharynx is clear.  Eyes:     Conjunctiva/sclera: Conjunctivae normal.  Neck:     Thyroid: No thyroid mass.  Cardiovascular:     Rate and Rhythm: Normal rate and regular rhythm.     Heart sounds: No murmur heard. Pulmonary:     Effort: Pulmonary effort is normal. No respiratory distress.     Breath sounds: Normal breath sounds.  Abdominal:     Palpations: Abdomen is soft. There is no hepatomegaly or mass.     Tenderness: There is no abdominal tenderness.  Lymphadenopathy:     Cervical: No cervical adenopathy.  Skin:    General: Skin is warm.     Findings: Ecchymosis (small one on right forearm.) present. No erythema or rash.  Neurological:     General: No focal deficit present.     Mental Status: She is alert and oriented to person, place, and time.     Cranial Nerves: No cranial nerve deficit.     Gait: Gait normal.  Psychiatric:     Comments: Well groomed, good eye contact.   ASSESSMENT AND PLAN:  Trilby was seen today for follow-up.  Diagnoses and all orders for this visit: Orders Placed This Encounter  Procedures   Comprehensive metabolic panel   CBC with Differential/Platelet   TSH   Hemoglobin A1c   Lab Results  Component Value Date   HGBA1C 5.2 05/19/2022   Lab Results  Component Value Date   TSH 2.21 05/19/2022   Lab Results  Component Value Date   CREATININE 0.95 05/19/2022   BUN 9 05/19/2022   NA 139 05/19/2022   K 3.9 05/19/2022   CL 108 05/19/2022   CO2 23 05/19/2022   Lab Results  Component Value Date   ALT 13 05/19/2022   AST  18 05/19/2022   ALKPHOS 71 05/19/2022   BILITOT 0.3 05/19/2022   Lab Results  Component Value Date   WBC 4.7 05/19/2022   HGB 13.9 05/19/2022   HCT 40.2 05/19/2022   MCV 86.5 05/19/2022   PLT 255.0 05/19/2022   Easy bruisability History and examination do not suggest a serious process. Monitor for new symptoms. Today we will obtain a CBC and TSH. Instructed about warning signs.  Insomnia, unspecified type Great improvement with SSRI. Continue adequate sleep hygiene.  Chronic diarrhea ? IBS. Continue avoiding foods that exacerbate  symptoms. Further recommendation will be given according to lab results.  If anemia is present, she may need GI evaluation.  Weight loss, non-intentional Most likely caused by decreased oral intake. Recommend avoiding skipping meals, eat at least 3/day. We will continue following. Further recommendation will be given according to lab results.  GAD (generalized anxiety disorder) Problem has improved. Continue sertraline 25 mg daily. CBT also recommended.  Return in about 3 months (around 08/19/2022).  Maliq Pilley G. [place], MD  [facility]. [place] office.

## 2022-05-19 ENCOUNTER — Ambulatory Visit (INDEPENDENT_AMBULATORY_CARE_PROVIDER_SITE_OTHER): Payer: No Typology Code available for payment source | Admitting: Family Medicine

## 2022-05-19 VITALS — BP 100/70 | HR 95 | Resp 12 | Ht 64.0 in | Wt 114.4 lb

## 2022-05-19 DIAGNOSIS — K529 Noninfective gastroenteritis and colitis, unspecified: Secondary | ICD-10-CM | POA: Diagnosis not present

## 2022-05-19 DIAGNOSIS — R233 Spontaneous ecchymoses: Secondary | ICD-10-CM

## 2022-05-19 DIAGNOSIS — F411 Generalized anxiety disorder: Secondary | ICD-10-CM

## 2022-05-19 DIAGNOSIS — G4700 Insomnia, unspecified: Secondary | ICD-10-CM

## 2022-05-19 DIAGNOSIS — R634 Abnormal weight loss: Secondary | ICD-10-CM

## 2022-05-19 LAB — COMPREHENSIVE METABOLIC PANEL
ALT: 13 U/L (ref 0–35)
AST: 18 U/L (ref 0–37)
Albumin: 4.3 g/dL (ref 3.5–5.2)
Alkaline Phosphatase: 71 U/L (ref 39–117)
BUN: 9 mg/dL (ref 6–23)
CO2: 23 mEq/L (ref 19–32)
Calcium: 9.3 mg/dL (ref 8.4–10.5)
Chloride: 108 mEq/L (ref 96–112)
Creatinine, Ser: 0.95 mg/dL (ref 0.40–1.20)
GFR: 85.49 mL/min (ref >60.00)
Glucose, Bld: 89 mg/dL (ref 70–99)
Potassium: 3.9 mEq/L (ref 3.5–5.1)
Sodium: 139 mEq/L (ref 135–145)
Total Bilirubin: 0.3 mg/dL (ref 0.2–1.2)
Total Protein: 7.2 g/dL (ref 6.0–8.3)

## 2022-05-19 LAB — CBC WITH DIFFERENTIAL/PLATELET
Basophils Absolute: 0 10*3/uL (ref 0.0–0.1)
Basophils Relative: 0.5 % (ref 0.0–3.0)
Eosinophils Absolute: 0.1 10*3/uL (ref 0.0–0.7)
Eosinophils Relative: 2 % (ref 0.0–5.0)
HCT: 40.2 % (ref 36.0–46.0)
Hemoglobin: 13.9 g/dL (ref 12.0–15.0)
Lymphocytes Relative: 37.9 % (ref 12.0–46.0)
Lymphs Abs: 1.8 10*3/uL (ref 0.7–4.0)
MCHC: 34.4 g/dL (ref 30.0–36.0)
MCV: 86.5 fl (ref 78.0–100.0)
Monocytes Absolute: 0.3 10*3/uL (ref 0.1–1.0)
Monocytes Relative: 7.1 % (ref 3.0–12.0)
Neutro Abs: 2.5 10*3/uL (ref 1.4–7.7)
Neutrophils Relative %: 52.5 % (ref 43.0–77.0)
Platelets: 255 10*3/uL (ref 150.0–400.0)
RBC: 4.65 Mil/uL (ref 3.87–5.11)
RDW: 12.2 % (ref 11.5–15.5)
WBC: 4.7 10*3/uL (ref 4.0–10.5)

## 2022-05-19 LAB — HEMOGLOBIN A1C: Hgb A1c MFr Bld: 5.2 % (ref 4.6–6.5)

## 2022-05-19 LAB — TSH: TSH: 2.21 u[IU]/mL (ref 0.35–5.50)

## 2022-05-19 NOTE — Patient Instructions (Addendum)
A few things to remember from today's visit:  Easy bruisability - Plan: CBC with Differential/Platelet, TSH  Weight loss, non-intentional - Plan: Comprehensive metabolic panel, CBC with Differential/Platelet, TSH, Hemoglobin A1c  Chronic diarrhea  If you need refills please call your pharmacy. Do not use My Chart to request refills or for acute issues that need immediate attention.   Start monitoring wt at home, in the [place]. Avoid skipping meals. No changes in Sertraline.  Please be sure medication list is accurate. If a new problem present, please set up appointment sooner than planned today.

## 2022-05-20 ENCOUNTER — Encounter: Payer: Self-pay | Admitting: Family Medicine

## 2022-05-25 NOTE — Progress Notes (Unsigned)
HPI: Lindsay Rosario is a 21 y.o. female, who is here today for her routine physical.  Last CPE: > A year ago.  She does not exercise regularly. She is trying not to skip meals, eating breakfast and cooking more at home.  Chronic medical problems: Depression, anxiety, chronic diarrhea,insomnia, migraine headaches, and history of seizures.  Immunization History  Administered Date(s) Administered   Hpv-Unspecified 04/08/2018, 06/30/2018, 09/23/2018   Influenza-Unspecified 08/25/2021   MMR 10/19/2001, 09/11/2004   PFIZER(Purple Top)SARS-COV-2 Vaccination 03/17/2020, 04/10/2020   PPD Test 05/15/2020   Tdap 06/22/2011   Health Maintenance  Topic Date Due   COVID-19 Vaccine (3 - Pfizer series) 06/05/2020   TETANUS/TDAP  06/21/2021   PAP-Cervical Cytology Screening  Never done   PAP SMEAR-Modifier  Never done   INFLUENZA VACCINE  06/02/2022   HPV VACCINES  Completed   Hepatitis C Screening  Completed   HIV Screening  Completed  Paps mere per pt report, done 1-2 years ago at the HD.  She has no concerns today.  Review of Systems  Constitutional:  Negative for activity change, appetite change and fever.  HENT:  Negative for hearing loss, mouth sores, sore throat and trouble swallowing.   Eyes:  Negative for redness and visual disturbance.  Respiratory:  Negative for cough, shortness of breath and wheezing.   Cardiovascular:  Negative for chest pain and leg swelling.  Gastrointestinal:  Negative for abdominal pain, nausea and vomiting.       No changes in bowel habits.  Endocrine: Negative for cold intolerance, heat intolerance, polydipsia, polyphagia and polyuria.  Genitourinary:  Negative for decreased urine volume, dysuria, hematuria, vaginal bleeding and vaginal discharge.  Musculoskeletal:  Negative for gait problem and myalgias.  Skin:  Negative for color change and rash.  Allergic/Immunologic: Negative for environmental allergies.  Neurological:  Positive for headaches  (No more than usual). Negative for seizures, syncope and weakness.  Hematological:  Negative for adenopathy. Does not bruise/bleed easily.  Psychiatric/Behavioral:  Negative for confusion and hallucinations.   All other systems reviewed and are negative.  Current Outpatient Medications on File Prior to Visit  Medication Sig Dispense Refill   etonogestrel-ethinyl estradiol (NUVARING) 0.12-0.015 MG/24HR vaginal ring Place 1 each vaginally every 28 (twenty-eight) days. 3 each 3   fluticasone (FLONASE) 50 MCG/ACT nasal spray Place 2 sprays into both nostrils daily. 16 g 6   sertraline (ZOLOFT) 25 MG tablet Take 1 tablet (25 mg total) by mouth daily. 30 tablet 3   triamcinolone cream (KENALOG) 0.1 % Apply 1 application  topically 2 (two) times daily as needed. 30 g 1   zonisamide (ZONEGRAN) 100 MG capsule Take 1 capsule (100 mg total) by mouth daily. 30 capsule 5   No current facility-administered medications on file prior to visit.   Past Medical History:  Diagnosis Date   GAD (generalized anxiety disorder) 04/14/2022   Headache(784.0)    Moderately severe recurrent major depression ([place]) 04/14/2022   Seizures ([place])    History reviewed. No pertinent surgical history.  Allergies  Allergen Reactions   Kiwi Extract     Lip goes numb    Family History  Problem Relation Age of Onset   Migraines Mother    Migraines Brother    Irritable bowel syndrome Maternal Aunt    Social History   Socioeconomic History   Marital status: Significant Other    Spouse name: Not on file   Number of children: Not on file   Years of education: Not on  file   Highest education level: Not on file  Occupational History   Not on file  Tobacco Use   Smoking status: Never   Smokeless tobacco: Never  Substance and Sexual Activity   Alcohol use: No   Drug use: Never   Sexual activity: Yes    Birth control/protection: Inserts  Other Topics Concern   Not on file  Social History Narrative   Carola is a  recent graduate from [employer]; she attends [employer] and majors in Radiology. She does well in school.   Lives with her grandparents   Social Determinants of Health   Financial Resource Strain: Not on file  Food Insecurity: Not on file  Transportation Needs: Not on file  Physical Activity: Not on file  Stress: Not on file  Social Connections: Not on file   Vitals:   05/26/22 1227  BP: 102/70  Pulse: 69  Resp: 12  SpO2: 97%  Body mass index is 20.13 kg/m. Wt Readings from Last 3 Encounters:  05/26/22 117 lb 4 oz (53.2 kg)  05/19/22 114 lb 6 oz (51.9 kg)  04/14/22 116 lb 12.8 oz (53 kg)   Physical Exam Vitals and nursing note reviewed.  Constitutional:      General: She is not in acute distress.    Appearance: She is well-developed and normal weight.  HENT:     Head: Normocephalic and atraumatic.     Right Ear: Tympanic membrane, ear canal and external ear normal.     Left Ear: Tympanic membrane, ear canal and external ear normal.     Mouth/Throat:     Mouth: Mucous membranes are moist.     Pharynx: Oropharynx is clear. Uvula midline.  Eyes:     Extraocular Movements: Extraocular movements intact.     Conjunctiva/sclera: Conjunctivae normal.     Pupils: Pupils are equal, round, and reactive to light.  Neck:     Thyroid: No thyroid mass or thyromegaly.  Cardiovascular:     Rate and Rhythm: Normal rate and regular rhythm.     Pulses:          Dorsalis pedis pulses are 2+ on the right side and 2+ on the left side.     Heart sounds: No murmur heard. Pulmonary:     Effort: Pulmonary effort is normal. No respiratory distress.     Breath sounds: Normal breath sounds.  Abdominal:     Palpations: Abdomen is soft. There is no hepatomegaly or mass.     Tenderness: There is no abdominal tenderness.  Genitourinary:    Comments: Deferred to gyn. Musculoskeletal:     Comments: No major deformity or signs of synovitis appreciated.  Lymphadenopathy:     Cervical: No  cervical adenopathy.     Upper Body:     Right upper body: No supraclavicular adenopathy.     Left upper body: No supraclavicular adenopathy.  Skin:    General: Skin is warm.     Findings: No erythema or rash.  Neurological:     General: No focal deficit present.     Mental Status: She is alert and oriented to person, place, and time.     Cranial Nerves: No cranial nerve deficit.     Coordination: Coordination normal.     Gait: Gait normal.     Deep Tendon Reflexes:     Reflex Scores:      Bicep reflexes are 2+ on the right side and 2+ on the left side.  Patellar reflexes are 2+ on the right side and 2+ on the left side. Psychiatric:     Comments: Well groomed, good eye contact.   ASSESSMENT AND PLAN:  Ms. JORENE KAYLOR was here today annual physical examination.  Orders Placed This Encounter  Procedures   Tdap vaccine greater than or equal to 7yo IM   Routine general medical examination at a health care facility We discussed the importance of regular physical activity and healthy diet for prevention of chronic illness and/or complications. Preventive guidelines reviewed. Vaccination updated. Pap smear reported as current, she is going to try to bring a copy of report or sign a release form. Next CPE in a year.  Need for Tdap vaccination -     Tdap vaccine greater than or equal to 7yo IM  Return in 4 months (on 09/26/2022) for follow up.  Keiara Sneeringer G. [place], MD  [facility]. [place] office.

## 2022-05-26 ENCOUNTER — Ambulatory Visit (INDEPENDENT_AMBULATORY_CARE_PROVIDER_SITE_OTHER): Payer: No Typology Code available for payment source | Admitting: Family Medicine

## 2022-05-26 ENCOUNTER — Encounter: Payer: Self-pay | Admitting: Family Medicine

## 2022-05-26 VITALS — BP 102/70 | HR 69 | Resp 12 | Ht 64.0 in | Wt 117.2 lb

## 2022-05-26 DIAGNOSIS — Z23 Encounter for immunization: Secondary | ICD-10-CM | POA: Diagnosis not present

## 2022-05-26 DIAGNOSIS — Z0000 Encounter for general adult medical examination without abnormal findings: Secondary | ICD-10-CM | POA: Diagnosis not present

## 2022-05-26 NOTE — Patient Instructions (Addendum)
A few things to remember from today's visit:  Routine general medical examination at a health care facility  We need a copy of your last Pap smear.  If you need refills please call your pharmacy. Do not use My Chart to request refills or for acute issues that need immediate attention.   Please be sure medication list is accurate. If a new problem present, please set up appointment sooner than planned today.  Health Maintenance, Female Adopting a healthy lifestyle and getting preventive care are important in promoting health and wellness. Ask your health care provider about: The right schedule for you to have regular tests and exams. Things you can do on your own to prevent diseases and keep yourself healthy. What should I know about diet, weight, and exercise? Eat a healthy diet  Eat a diet that includes plenty of vegetables, fruits, low-fat dairy products, and lean protein. Do not eat a lot of foods that are high in solid fats, added sugars, or sodium. Maintain a healthy weight Body mass index (BMI) is used to identify weight problems. It estimates body fat based on height and weight. Your health care provider can help determine your BMI and help you achieve or maintain a healthy weight. Get regular exercise Get regular exercise. This is one of the most important things you can do for your health. Most adults should: Exercise for at least 150 minutes each week. The exercise should increase your heart rate and make you sweat (moderate-intensity exercise). Do strengthening exercises at least twice a week. This is in addition to the moderate-intensity exercise. Spend less time sitting. Even light physical activity can be beneficial. Watch cholesterol and blood lipids Have your blood tested for lipids and cholesterol at 22 years of age, then have this test every 5 years. Have your cholesterol levels checked more often if: Your lipid or cholesterol levels are high. You are older than 22  years of age. You are at high risk for heart disease. What should I know about cancer screening? Depending on your health history and family history, you may need to have cancer screening at various ages. This may include screening for: Breast cancer. Cervical cancer. Colorectal cancer. Skin cancer. Lung cancer. What should I know about heart disease, diabetes, and high blood pressure? Blood pressure and heart disease High blood pressure causes heart disease and increases the risk of stroke. This is more likely to develop in people who have high blood pressure readings or are overweight. Have your blood pressure checked: Every 3-5 years if you are 20-12 years of age. Every year if you are 31 years old or older. Diabetes Have regular diabetes screenings. This checks your fasting blood sugar level. Have the screening done: Once every three years after age 29 if you are at a normal weight and have a low risk for diabetes. More often and at a younger age if you are overweight or have a high risk for diabetes. What should I know about preventing infection? Hepatitis B If you have a higher risk for hepatitis B, you should be screened for this virus. Talk with your health care provider to find out if you are at risk for hepatitis B infection. Hepatitis C Testing is recommended for: Everyone born from [birth year] through 1966. Anyone with known risk factors for hepatitis C. Sexually transmitted infections (STIs) Get screened for STIs, including gonorrhea and chlamydia, if: You are sexually active and are younger than 22 years of age. You are older than 22 years  of age and your health care provider tells you that you are at risk for this type of infection. Your sexual activity has changed since you were last screened, and you are at increased risk for chlamydia or gonorrhea. Ask your health care provider if you are at risk. Ask your health care provider about whether you are at high risk for HIV. Your  health care provider may recommend a prescription medicine to help prevent HIV infection. If you choose to take medicine to prevent HIV, you should first get tested for HIV. You should then be tested every 3 months for as long as you are taking the medicine. Pregnancy If you are about to stop having your period (premenopausal) and you may become pregnant, seek counseling before you get pregnant. Take 400 to 800 micrograms (mcg) of folic acid every day if you become pregnant. Ask for birth control (contraception) if you want to prevent pregnancy. Osteoporosis and menopause Osteoporosis is a disease in which the bones lose minerals and strength with aging. This can result in bone fractures. If you are 69 years old or older, or if you are at risk for osteoporosis and fractures, ask your health care provider if you should: Be screened for bone loss. Take a calcium or vitamin D supplement to lower your risk of fractures. Be given hormone replacement therapy (HRT) to treat symptoms of menopause. Follow these instructions at home: Alcohol use Do not drink alcohol if: Your health care provider tells you not to drink. You are pregnant, may be pregnant, or are planning to become pregnant. If you drink alcohol: Limit how much you have to: 0-1 drink a day. Know how much alcohol is in your drink. In the [place] one drink equals one 12 oz bottle of beer (355 mL), one 5 oz glass of wine (148 mL), or one 1 oz glass of hard liquor (44 mL). Lifestyle Do not use any products that contain nicotine or tobacco. These products include cigarettes, chewing tobacco, and vaping devices, such as e-cigarettes. If you need help quitting, ask your health care provider. Do not use street drugs. Do not share needles. Ask your health care provider for help if you need support or information about quitting drugs. General instructions Schedule regular health, dental, and eye exams. Stay current with your vaccines. Tell your  health care provider if: You often feel depressed. You have ever been abused or do not feel safe at home. Summary Adopting a healthy lifestyle and getting preventive care are important in promoting health and wellness. Follow your health care provider's instructions about healthy diet, exercising, and getting tested or screened for diseases. Follow your health care provider's instructions on monitoring your cholesterol and blood pressure. This information is not intended to replace advice given to you by your health care provider. Make sure you discuss any questions you have with your health care provider. Document Revised: 03/10/2021 Document Reviewed: 03/10/2021 Elsevier Patient Education  2023 [employer].

## 2022-06-03 ENCOUNTER — Telehealth: Payer: Self-pay | Admitting: Family Medicine

## 2022-06-03 DIAGNOSIS — Z3044 Encounter for surveillance of vaginal ring hormonal contraceptive device: Secondary | ICD-10-CM

## 2022-06-03 MED ORDER — ETONOGESTREL-ETHINYL ESTRADIOL 0.12-0.015 MG/24HR VA RING: 1.0000 | VAGINAL_RING | VAGINAL | 12 refills | Status: DC

## 2022-06-03 NOTE — Telephone Encounter (Signed)
Pt requests 1 month refill etonogestrel-ethinyl estradiol (NUVARING) 0.12-0.015 MG/24HR vaginal ring  [id number] [place], [place] - [address] Phone:  [phone]  Fax:  [phone]     States Dr [place] will need to become new prescribing provider

## 2022-06-17 ENCOUNTER — Encounter: Payer: Self-pay | Admitting: Family Medicine

## 2022-06-17 DIAGNOSIS — F411 Generalized anxiety disorder: Secondary | ICD-10-CM

## 2022-06-22 NOTE — Progress Notes (Unsigned)
HPI: Lindsay Rosario is a 21 y.o. female with hx of migraine headaches,GAD,and depression here today c/o "feeling tired all the time." She was last seen on 05/26/22 for her CPE. Depression and anxiety, she was started on Sertraline 25 mg in 04/2022, which has helped with some symptoms (sleep and motivation). She is seeing psychotherapist every 2 weeks.  Despite of sleeping better she is still feeling fatigue, it is hard for her to get up in the morning. During days off she sleeps until late afternoon. During week days she goes to sleep between 6:30 pm to 12:30 am, wakes up for an hours and goes back to sleep.   Feeling "cold" all the time. Recent blood work otherwise normal. She would like B12 and vit D check, no known hx of vit deficiency.  Lab Results  Component Value Date   TSH 2.21 05/19/2022   Lab Results  Component Value Date   CREATININE 0.95 05/19/2022   BUN 9 05/19/2022   NA 139 05/19/2022   K 3.9 05/19/2022   CL 108 05/19/2022   CO2 23 05/19/2022   Lab Results  Component Value Date   ALT 13 05/19/2022   AST 18 05/19/2022   ALKPHOS 71 05/19/2022   BILITOT 0.3 05/19/2022   Review of Systems  Constitutional:  Negative for activity change, appetite change and fever.  HENT:  Negative for mouth sores and nosebleeds.   Respiratory:  Negative for cough, shortness of breath and wheezing.   Gastrointestinal:  Negative for abdominal pain, nausea and vomiting.       Negative for changes in bowel habits.  Endocrine: Negative for cold intolerance and heat intolerance.  Genitourinary:  Negative for decreased urine volume, dysuria and hematuria.  Musculoskeletal:  Positive for arthralgias (Occasional knee pain). Negative for joint swelling and myalgias.  Skin:  Negative for rash.  Neurological:  Negative for syncope and weakness.  Rest see pertinent positives and negatives per HPI.  Current Outpatient Medications on File Prior to Visit  Medication Sig Dispense Refill    etonogestrel-ethinyl estradiol (NUVARING) 0.12-0.015 MG/24HR vaginal ring Place 1 each vaginally every 28 (twenty-eight) days. 1 each 12   fluticasone (FLONASE) 50 MCG/ACT nasal spray Place 2 sprays into both nostrils daily. 16 g 6   triamcinolone cream (KENALOG) 0.1 % Apply 1 application  topically 2 (two) times daily as needed. 30 g 1   zonisamide (ZONEGRAN) 100 MG capsule Take 1 capsule (100 mg total) by mouth daily. 30 capsule 5   No current facility-administered medications on file prior to visit.   Past Medical History:  Diagnosis Date   GAD (generalized anxiety disorder) 04/14/2022   Headache(784.0)    Moderately severe recurrent major depression (HCC) 04/14/2022   Seizures (HCC)    Allergies  Allergen Reactions   Kiwi Extract     Lip goes numb    Social History   Socioeconomic History   Marital status: Significant Other    Spouse name: Not on file   Number of children: Not on file   Years of education: Not on file   Highest education level: Not on file  Occupational History   Not on file  Tobacco Use   Smoking status: Never   Smokeless tobacco: Never  Substance and Sexual Activity   Alcohol use: No   Drug use: Never   Sexual activity: Yes    Birth control/protection: Inserts  Other Topics Concern   Not on file  Social History Narrative   Lindsay Rosario is  a recent graduate from [employer]; she attends [employer] and majors in Radiology. She does well in school.   Lives with her grandparents   Social Determinants of Health   Financial Resource Strain: Not on file  Food Insecurity: Not on file  Transportation Needs: Not on file  Physical Activity: Not on file  Stress: Not on file  Social Connections: Not on file   Vitals:   06/23/22 1633  BP: 100/62  Pulse: 100  Resp: 12  Temp: 98.9 F (37.2 C)  SpO2: 98%   Body mass index is 20.45 kg/m.  Physical Exam Vitals and nursing note reviewed.  Constitutional:      General: She is not in acute distress.     Appearance: She is well-developed and well-groomed.  HENT:     Head: Normocephalic and atraumatic.  Eyes:     Conjunctiva/sclera: Conjunctivae normal.  Cardiovascular:     Rate and Rhythm: Normal rate and regular rhythm.     Heart sounds: No murmur heard. Pulmonary:     Effort: Pulmonary effort is normal. No respiratory distress.     Breath sounds: Normal breath sounds.  Abdominal:     Palpations: Abdomen is soft. There is no hepatomegaly or mass.     Tenderness: There is no abdominal tenderness.  Musculoskeletal:     Comments: No signs of synovitis.  Skin:    General: Skin is warm.     Findings: No erythema or rash.  Neurological:     General: No focal deficit present.     Mental Status: She is alert and oriented to person, place, and time.     Cranial Nerves: No cranial nerve deficit.     Gait: Gait normal.  Psychiatric:        Mood and Affect: Mood is anxious.   ASSESSMENT AND PLAN:  Lindsay Rosario was seen today for follow-up.  Diagnoses and all orders for this visit: Orders Placed This Encounter  Procedures   C-reactive protein   Vitamin B12   VITAMIN D 25 Hydroxy (Vit-D Deficiency, Fractures)   Lab Results  Component Value Date   VITAMINB12 111 (L) 06/24/2022   Lab Results  Component Value Date   CRP <1.0 06/24/2022   Fatigue, unspecified type We discussed possible etiologies. This is a chronic problem. Hx and examination do not suggest a serious process. Work up so far has been negative, Vit D and Vit B12 deficiency could certainly be contributing factors and well as depression. Continue a healthful diet and regular physical activity.  Moderately severe recurrent major depression (HCC) Symptoms have improved. She has tolerated medication  -     sertraline (ZOLOFT) 50 MG tablet; Take 1 tablet (50 mg total) by mouth daily.  Vitamin D insufficiency Will recommend OTC vit D 2000 U daily. We can repeat 25 OH vit D in 3-6 months.  B12 deficiency Will recommend  B12 1000 mcg weekly x 4 and then q 4-5 weeks.  Return if symptoms worsen or fail to improve, for Keep next appt..  Wil Slape G. [place], MD  [facility]. [place] office.

## 2022-06-23 ENCOUNTER — Ambulatory Visit (INDEPENDENT_AMBULATORY_CARE_PROVIDER_SITE_OTHER): Payer: No Typology Code available for payment source | Admitting: Family Medicine

## 2022-06-23 ENCOUNTER — Encounter: Payer: Self-pay | Admitting: Family Medicine

## 2022-06-23 VITALS — BP 100/62 | HR 100 | Temp 98.9°F | Resp 12 | Ht 64.0 in | Wt 119.1 lb

## 2022-06-23 DIAGNOSIS — R5383 Other fatigue: Secondary | ICD-10-CM

## 2022-06-23 DIAGNOSIS — E538 Deficiency of other specified B group vitamins: Secondary | ICD-10-CM

## 2022-06-23 DIAGNOSIS — Z0189 Encounter for other specified special examinations: Secondary | ICD-10-CM | POA: Diagnosis not present

## 2022-06-23 DIAGNOSIS — E559 Vitamin D deficiency, unspecified: Secondary | ICD-10-CM | POA: Diagnosis not present

## 2022-06-23 DIAGNOSIS — F332 Major depressive disorder, recurrent severe without psychotic features: Secondary | ICD-10-CM

## 2022-06-23 MED ORDER — SERTRALINE HCL 50 MG PO TABS
50.0000 mg | ORAL_TABLET | Freq: Every day | ORAL | 0 refills | Status: DC
  Filled 2022-12-11: qty 30, 30d supply, fill #0

## 2022-06-23 NOTE — Patient Instructions (Addendum)
A few things to remember from today's visit:   Fatigue, unspecified type - Plan: C-reactive protein  Moderately severe recurrent major depression (HCC) - Plan: sertraline (ZOLOFT) 50 MG tablet  Patient requested diagnostic testing - Plan: Vitamin B12, VITAMIN D 25 Hydroxy (Vit-D Deficiency, Fractures)  If you need refills please call your pharmacy. Do not use My Chart to request refills or for acute issues that need immediate attention.   Sertraline increased from 25 mg to 50 mg.  Please be sure medication list is accurate. If a new problem present, please set up appointment sooner than planned today.

## 2022-06-24 ENCOUNTER — Other Ambulatory Visit (INDEPENDENT_AMBULATORY_CARE_PROVIDER_SITE_OTHER): Payer: No Typology Code available for payment source

## 2022-06-24 DIAGNOSIS — E559 Vitamin D deficiency, unspecified: Secondary | ICD-10-CM | POA: Insufficient documentation

## 2022-06-24 DIAGNOSIS — R5383 Other fatigue: Secondary | ICD-10-CM

## 2022-06-24 DIAGNOSIS — Z0189 Encounter for other specified special examinations: Secondary | ICD-10-CM | POA: Diagnosis not present

## 2022-06-24 DIAGNOSIS — E538 Deficiency of other specified B group vitamins: Secondary | ICD-10-CM | POA: Insufficient documentation

## 2022-06-24 LAB — C-REACTIVE PROTEIN: CRP: <1 mg/dL (ref 0.5–20.0)

## 2022-06-24 LAB — VITAMIN D 25 HYDROXY (VIT D DEFICIENCY, FRACTURES): VITD: 24.71 ng/mL — ABNORMAL LOW (ref 30.00–100.00)

## 2022-06-24 LAB — VITAMIN B12: Vitamin B-12: 111 pg/mL — ABNORMAL LOW (ref 211–911)

## 2022-06-26 ENCOUNTER — Ambulatory Visit (INDEPENDENT_AMBULATORY_CARE_PROVIDER_SITE_OTHER): Payer: No Typology Code available for payment source

## 2022-06-26 DIAGNOSIS — E538 Deficiency of other specified B group vitamins: Secondary | ICD-10-CM | POA: Diagnosis not present

## 2022-06-26 MED ORDER — CYANOCOBALAMIN 1000 MCG/ML IJ SOLN
1000.0000 ug | Freq: Once | INTRAMUSCULAR | Status: AC
  Administered 2022-06-26: 1000 ug via INTRAMUSCULAR

## 2022-06-26 NOTE — Progress Notes (Signed)
Pt here for monthly B12 injection per Dr. Jordan.  B12 1000mcg given IM, and pt tolerated injection well.  

## 2022-06-29 ENCOUNTER — Ambulatory Visit (INDEPENDENT_AMBULATORY_CARE_PROVIDER_SITE_OTHER): Payer: No Typology Code available for payment source | Admitting: Family Medicine

## 2022-06-29 ENCOUNTER — Encounter: Payer: Self-pay | Admitting: Family Medicine

## 2022-06-29 VITALS — BP 100/60 | HR 97 | Temp 98.7°F | Ht 64.0 in | Wt 115.6 lb

## 2022-06-29 DIAGNOSIS — J029 Acute pharyngitis, unspecified: Secondary | ICD-10-CM

## 2022-06-29 DIAGNOSIS — R0981 Nasal congestion: Secondary | ICD-10-CM

## 2022-06-29 DIAGNOSIS — J069 Acute upper respiratory infection, unspecified: Secondary | ICD-10-CM | POA: Diagnosis not present

## 2022-06-29 LAB — POC INFLUENZA A&B (BINAX/QUICKVUE)
Influenza A, POC: NEGATIVE
Influenza B, POC: NEGATIVE

## 2022-06-29 LAB — POC COVID19 BINAXNOW: SARS Coronavirus 2 Ag: NEGATIVE

## 2022-06-29 LAB — POCT RAPID STREP A (OFFICE): Rapid Strep A Screen: NEGATIVE

## 2022-06-29 NOTE — Progress Notes (Signed)
Established Patient Office Visit  Subjective   Patient ID: Lindsay Rosario, female    DOB: [birth year]  Age: 21 y.o. MRN: [id number]  Chief Complaint  Patient presents with   Nasal Congestion    Patient complains of nasal congestion, x2 days, Tried Dayquil with little relief   Sore Throat    Patient complains of sore throat, x2 days,    Otalgia    Patient complains of otalgia, x2 days,    Nausea    Patient complains of nausea, x1 day,     HPI   Lindsay Rosario is seen with upper respiratory symptoms.  She helps watch her 66-month-old nephew who had recent URI symptoms.  She has congestion and also some right ear pain and sore throat.  Denies any wheezing.  Denies nausea, vomiting, or diarrhea.  She took some DayQuil which helped her symptoms yesterday  Past Medical History:  Diagnosis Date   GAD (generalized anxiety disorder) 04/14/2022   Headache(784.0)    Moderately severe recurrent major depression (HCC) 04/14/2022   Seizures (HCC)    History reviewed. No pertinent surgical history.  reports that she has never smoked. She has never used smokeless tobacco. She reports that she does not drink alcohol and does not use drugs. family history includes Irritable bowel syndrome in her maternal aunt; Migraines in her brother and mother. Allergies  Allergen Reactions   Kiwi Extract     Lip goes numb     Review of Systems  Constitutional:  Positive for malaise/fatigue. Negative for fever.  HENT:  Positive for congestion and sore throat.   Respiratory:  Negative for shortness of breath and wheezing.   Cardiovascular:  Negative for chest pain.  Gastrointestinal:  Negative for abdominal pain, diarrhea and nausea.      Objective:     BP 100/60 (BP Location: Left Arm, Patient Position: Sitting, Cuff Size: Normal)   Pulse 97   Temp 98.7 F (37.1 C) (Oral)   Ht 5\' 4"  (1.626 m)   Wt 115 lb 9.6 oz (52.4 kg)   SpO2 96%   BMI 19.84 kg/m    Physical Exam Vitals reviewed.   Constitutional:      General: She is not in acute distress.    Appearance: She is well-developed. She is not ill-appearing.  HENT:     Right Ear: Tympanic membrane normal.     Left Ear: Tympanic membrane normal.     Mouth/Throat:     Mouth: Mucous membranes are moist.     Pharynx: Oropharynx is clear.     Comments: 2+ symmetrically enlarged tonsils.  No exudate. Cardiovascular:     Rate and Rhythm: Normal rate and regular rhythm.  Musculoskeletal:     Cervical back: Neck supple.  Lymphadenopathy:     Cervical: No cervical adenopathy.  Skin:    Findings: No rash.  Neurological:     Mental Status: She is alert.      Results for orders placed or performed in visit on 06/29/22  POC COVID-19  Result Value Ref Range   SARS Coronavirus 2 Ag Negative Negative  POC Rapid Strep A  Result Value Ref Range   Rapid Strep A Screen Negative Negative  POC Influenza A&B(BINAX/QUICKVUE)  Result Value Ref Range   Influenza A, POC Negative Negative   Influenza B, POC Negative Negative      The ASCVD Risk score (Arnett DK, et al., 2019) failed to calculate for the following reasons:   The 2019 ASCVD risk score  is only valid for ages 32 to 63    Assessment & Plan:   Problem List Items Addressed This Visit   None Visit Diagnoses     Nasal congestion    -  Primary   Relevant Orders   POC COVID-19 (Completed)   POC Influenza A&B(BINAX/QUICKVUE) (Completed)   Sore throat       Relevant Orders   POC Rapid Strep A (Completed)     Suspected viral URI.  No evidence for ear infection at this time.  Rapid strep testing negative.  COVID testing negative.  Influenza testing negative.  -Plenty fluids and rest -Continue over-the-counter medications as needed -Follow-up for any persistent or worsening symptoms.  No follow-ups on file.    Evelena Peat, MD

## 2022-06-29 NOTE — Progress Notes (Signed)
P 

## 2022-07-01 ENCOUNTER — Ambulatory Visit (INDEPENDENT_AMBULATORY_CARE_PROVIDER_SITE_OTHER): Payer: No Typology Code available for payment source

## 2022-07-01 DIAGNOSIS — E538 Deficiency of other specified B group vitamins: Secondary | ICD-10-CM | POA: Diagnosis not present

## 2022-07-01 MED ORDER — CYANOCOBALAMIN 1000 MCG/ML IJ SOLN
1000.0000 ug | Freq: Once | INTRAMUSCULAR | Status: AC
  Administered 2022-07-01: 1000 ug via INTRAMUSCULAR

## 2022-07-01 NOTE — Progress Notes (Signed)
Pt here for monthly B12 injection per Dr. Jordan.  B12 1000mcg given IM, and pt tolerated injection well.  

## 2022-07-03 ENCOUNTER — Ambulatory Visit (INDEPENDENT_AMBULATORY_CARE_PROVIDER_SITE_OTHER): Payer: No Typology Code available for payment source | Admitting: Family Medicine

## 2022-07-03 ENCOUNTER — Encounter: Payer: Self-pay | Admitting: Family Medicine

## 2022-07-03 VITALS — BP 104/60 | HR 80 | Temp 98.2°F | Ht 64.0 in | Wt 116.5 lb

## 2022-07-03 DIAGNOSIS — H109 Unspecified conjunctivitis: Secondary | ICD-10-CM

## 2022-07-03 MED ORDER — POLYMYXIN B-TRIMETHOPRIM 10000-0.1 UNIT/ML-% OP SOLN: 2.0000 [drp] | OPHTHALMIC | 0 refills | Status: DC

## 2022-07-03 NOTE — Progress Notes (Signed)
   Established Patient Office Visit  Subjective   Patient ID: Lindsay Rosario, female    DOB: [birth year]  Age: 21 y.o. MRN: [id number]  Chief Complaint  Patient presents with   Conjunctivitis    Patient complains of conjunctivitis, x1 day     HPI   1 day history of right eye symptoms.  She had some redness and matting.  She is just getting over viral URI.  Still has some lingering cough.  No fever.  No visual changes.  No eye pain.  No contact use.  No known drug allergies.  Left eye no symptoms.  Past Medical History:  Diagnosis Date   GAD (generalized anxiety disorder) 04/14/2022   Headache(784.0)    Moderately severe recurrent major depression (HCC) 04/14/2022   Seizures (HCC)    History reviewed. No pertinent surgical history.  reports that she has never smoked. She has never used smokeless tobacco. She reports that she does not drink alcohol and does not use drugs. family history includes Irritable bowel syndrome in her maternal aunt; Migraines in her brother and mother. Allergies  Allergen Reactions   Kiwi Extract     Lip goes numb     Review of Systems  Constitutional:  Negative for chills and fever.  Eyes:  Positive for discharge and redness. Negative for blurred vision and pain.      Objective:     BP 104/60 (BP Location: Left Arm, Patient Position: Sitting, Cuff Size: Normal)   Pulse 80   Temp 98.2 F (36.8 C) (Oral)   Ht 5\' 4"  (1.626 m)   Wt 116 lb 8 oz (52.8 kg)   SpO2 96%   BMI 20.00 kg/m    Physical Exam Vitals reviewed.  Constitutional:      Appearance: Normal appearance.  Eyes:     Comments: Right eye reveals conjunctival erythema.  No visible purulent drainage at this time.  Funduscopic exam unremarkable.  Cardiovascular:     Rate and Rhythm: Normal rate and regular rhythm.  Pulmonary:     Effort: Pulmonary effort is normal.     Breath sounds: Normal breath sounds. No wheezing or rales.  Neurological:     Mental Status: She is alert.       No results found for any visits on 07/03/22.    The ASCVD Risk score (Arnett DK, et al., 2019) failed to calculate for the following reasons:   The 2019 ASCVD risk score is only valid for ages 88 to 15    Assessment & Plan:   Bacterial conjunctivitis involving right eye -Continue warm compresses -Polytrim ophthalmic drops 2 drops right eye every 4 hours while awake -Touch base if symptoms not resolving in 3 to 4 days  No follow-ups on file.    76, MD

## 2022-07-09 ENCOUNTER — Ambulatory Visit (INDEPENDENT_AMBULATORY_CARE_PROVIDER_SITE_OTHER): Payer: No Typology Code available for payment source

## 2022-07-09 DIAGNOSIS — E538 Deficiency of other specified B group vitamins: Secondary | ICD-10-CM

## 2022-07-09 MED ORDER — CYANOCOBALAMIN 1000 MCG/ML IJ SOLN
1000.0000 ug | Freq: Once | INTRAMUSCULAR | Status: AC
  Administered 2022-07-09: 1000 ug via INTRAMUSCULAR

## 2022-07-09 NOTE — Progress Notes (Signed)
Per orders of Dr. Salomon Fick, injection of VitB 12 1000 mcg given by Vickii Chafe. Patient tolerated injection well.   Next vitB 12 injection is due next week.

## 2022-07-12 ENCOUNTER — Ambulatory Visit
Admission: EM | Admit: 2022-07-12 | Discharge: 2022-07-12 | Disposition: A | Discharge status: Home / Self Care | Payer: No Typology Code available for payment source | Attending: Internal Medicine | Admitting: Internal Medicine

## 2022-07-12 DIAGNOSIS — Z20822 Contact with and (suspected) exposure to covid-19: Secondary | ICD-10-CM

## 2022-07-12 DIAGNOSIS — J029 Acute pharyngitis, unspecified: Secondary | ICD-10-CM

## 2022-07-12 DIAGNOSIS — H9201 Otalgia, right ear: Secondary | ICD-10-CM | POA: Diagnosis not present

## 2022-07-12 DIAGNOSIS — J039 Acute tonsillitis, unspecified: Secondary | ICD-10-CM | POA: Diagnosis not present

## 2022-07-12 LAB — POCT RAPID STREP A (OFFICE): Rapid Strep A Screen: NEGATIVE

## 2022-07-12 LAB — SARS CORONAVIRUS 2 BY RT PCR: SARS Coronavirus 2 by RT PCR: NEGATIVE

## 2022-07-12 MED ORDER — AMOXICILLIN 500 MG PO CAPS: 500.0000 mg | ORAL_CAPSULE | Freq: Two times a day (BID) | ORAL | 0 refills | Status: AC

## 2022-07-12 NOTE — Discharge Instructions (Addendum)
Rapid strep is negative but I am still suspicious of strep throat as we discussed today so you are being treated with amoxicillin antibiotic.  Your throat culture and COVID test are pending.  We will call if they are abnormal.  Please follow-up if any symptoms persist or worsen.

## 2022-07-12 NOTE — ED Provider Notes (Signed)
[name]    CSN: [id number] Arrival date & time: 07/12/22  1307      History   Chief Complaint Chief Complaint  Patient presents with   right ear pain    HPI Lindsay Rosario is a 21 y.o. female.   Patient presents with sore throat and right ear pain that started yesterday.  Denies any associated nasal congestion, runny nose, cough, fever.  Denies any known sick contacts.  Patient has not taken any medications to help alleviate symptoms.     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 No pertinent surgical history.  OB History   No obstetric history on file.      Home Medications    Prior to Admission medications   Medication Sig Start Date End Date Taking? Authorizing Provider  amoxicillin (AMOXIL) 500 MG capsule Take 1 capsule (500 mg total) by mouth 2 (two) times daily for 10 days. 07/12/22 07/22/22 Yes Tynesha Free, Acie Fredrickson, FNP  etonogestrel-ethinyl estradiol (NUVARING) 0.12-0.015 MG/24HR vaginal ring Place 1 each vaginally every 28 (twenty-eight) days. 06/03/22   [place], Betty G, MD  fluticasone [facility]) 50 MCG/ACT nasal spray Place 2 sprays into both nostrils daily. 09/11/21   Nafziger, Kandee Keen, NP  sertraline (ZOLOFT) 50 MG tablet Take 1 tablet (50 mg total) by mouth daily. 06/23/22   [place], Betty G, MD   triamcinolone cream (KENALOG) 0.1 % Apply 1 application  topically 2 (two) times daily as needed. 04/14/22   [place], Betty G, MD  trimethoprim-polymyxin b Joaquim Lai) ophthalmic solution Place 2 drops into the right eye every 4 (four) hours. 07/03/22   Burchette, Elberta Fortis, MD  zonisamide (ZONEGRAN) 100 MG capsule Take 1 capsule (100 mg total) by mouth daily. 02/16/22   Drema Dallas, DO    Family History Family History  Problem Relation Age of Onset   Migraines Mother    Migraines Brother    Irritable bowel syndrome Maternal Aunt     Social History Social History   Tobacco Use   Smoking status: Never   Smokeless tobacco: Never  Substance Use Topics   Alcohol use: No   Drug use: Never     Allergies   Kiwi extract   Review of Systems Review of Systems Per HPI  Physical Exam Triage Vital Signs ED Triage Vitals [07/12/22 1359]  Enc Vitals Group     BP 111/76     Pulse Rate 100     Resp 18     Temp 98.7 F (37.1 C)     Temp Source Oral     SpO2 97 %     Weight      Height      Head Circumference      Peak Flow      Pain Score 4     Pain Loc      Pain Edu?  Excl. in GC?    No data found.  Updated Vital Signs BP 111/76 (BP Location: Left Arm)   Pulse 100   Temp 98.7 F (37.1 C) (Oral)   Resp 18   SpO2 97%   Visual Acuity Right Eye Distance:   Left Eye Distance:   Bilateral Distance:    Right Eye Near:   Left Eye Near:    Bilateral Near:     Physical Exam Constitutional:      General: She is not in acute distress.    Appearance: Normal appearance. She is not toxic-appearing or diaphoretic.  HENT:     Head: Normocephalic and atraumatic.     Right Ear: Tympanic membrane and ear canal normal.     Left Ear: Tympanic membrane and ear canal normal.     Nose: No congestion.     Mouth/Throat:     Mouth: Mucous membranes are moist.     Pharynx: Posterior oropharyngeal erythema present. No pharyngeal swelling, oropharyngeal exudate or uvula swelling.      Tonsils: Tonsillar exudate present. 1+ on the right. 1+ on the left.  Eyes:     Extraocular Movements: Extraocular movements intact.     Conjunctiva/sclera: Conjunctivae normal.     Pupils: Pupils are equal, round, and reactive to light.  Cardiovascular:     Rate and Rhythm: Normal rate and regular rhythm.     Pulses: Normal pulses.     Heart sounds: Normal heart sounds.  Pulmonary:     Effort: Pulmonary effort is normal. No respiratory distress.     Breath sounds: Normal breath sounds. No stridor. No wheezing, rhonchi or rales.  Abdominal:     General: Abdomen is flat. Bowel sounds are normal.     Palpations: Abdomen is soft.  Musculoskeletal:        General: Normal range of motion.     Cervical back: Normal range of motion.  Skin:    General: Skin is warm and dry.  Neurological:     General: No focal deficit present.     Mental Status: She is alert and oriented to person, place, and time. Mental status is at baseline.  Psychiatric:        Mood and Affect: Mood normal.        Behavior: Behavior normal.      UC Treatments / Results  Labs (all labs ordered are listed, but only abnormal results are displayed) Labs Reviewed  CULTURE, GROUP A STREP ([facility])  SARS CORONAVIRUS 2 BY RT PCR  POCT RAPID STREP A (OFFICE)    EKG   Radiology No results found.  Procedures Procedures (including critical care time)  Medications Ordered in UC Medications - No data to display  Initial Impression / Assessment and Plan / UC Course  I have reviewed the triage vital signs and the nursing notes.  Pertinent labs & imaging results that were available during my care of the patient were reviewed by me and considered in my medical decision making (see chart for details).     Rapid strep was negative.  Although, I am still suspicious of strep throat given appearance of posterior pharynx on exam.  Will opt to treat with amoxicillin antibiotic.  Throat culture is pending.  Discussed  with patient the possibility that viral illness could be present and COVID test is pending.  Discussed supportive care with patient.  Discussed return precautions.  No signs of peritonsillar abscess on exam.  Patient verbalized understanding and was agreeable with plan.  Final Clinical Impressions(s) / UC Diagnoses   Final diagnoses:  Acute tonsillitis, unspecified etiology  Sore throat  Right ear pain  Encounter for laboratory testing for COVID-19 virus     Discharge Instructions      Rapid strep is negative but I am still suspicious of strep throat as we discussed today so you are being treated with amoxicillin antibiotic.  Your throat culture and COVID test are pending.  We will call if they are abnormal.  Please follow-up if any symptoms persist or worsen.     ED Prescriptions     Medication Sig Dispense Auth. Provider   amoxicillin (AMOXIL) 500 MG capsule Take 1 capsule (500 mg total) by mouth 2 (two) times daily for 10 days. 20 capsule Gustavus Bryant, [place]      PDMP not reviewed this encounter.   Gustavus Bryant, [place] 07/12/22 [phone]

## 2022-07-12 NOTE — ED Triage Notes (Signed)
Pt c/o right throat pain and right ear pain x 2-3 days that is limiting food intake 2/2 pain.

## 2022-07-15 LAB — CULTURE, GROUP A STREP (THRC)

## 2022-07-17 ENCOUNTER — Ambulatory Visit (INDEPENDENT_AMBULATORY_CARE_PROVIDER_SITE_OTHER): Payer: No Typology Code available for payment source

## 2022-07-17 DIAGNOSIS — E538 Deficiency of other specified B group vitamins: Secondary | ICD-10-CM | POA: Diagnosis not present

## 2022-07-17 MED ORDER — CYANOCOBALAMIN 1000 MCG/ML IJ SOLN
1000.0000 ug | Freq: Once | INTRAMUSCULAR | Status: AC
  Administered 2022-07-17: 1000 ug via INTRAMUSCULAR

## 2022-07-17 NOTE — Progress Notes (Signed)
Pt here for monthly B12 injection per Dr. Jordan.  B12 1000mcg given IM, and pt tolerated injection well.  

## 2022-07-21 NOTE — Progress Notes (Deleted)
    ACUTE VISIT No chief complaint on file.  HPI: [name] is a 21 y.o. female, who is here today complaining of *** HPI  Review of Systems Rest see pertinent positives and negatives per HPI.  Current Outpatient Medications on File Prior to Visit  Medication Sig Dispense Refill  . amoxicillin (AMOXIL) 500 MG capsule Take 1 capsule (500 mg total) by mouth 2 (two) times daily for 10 days. 20 capsule 0  . etonogestrel-ethinyl estradiol (NUVARING) 0.12-0.015 MG/24HR vaginal ring Place 1 each vaginally every 28 (twenty-eight) days. 1 each 12  . fluticasone (FLONASE) 50 MCG/ACT nasal spray Place 2 sprays into both nostrils daily. 16 g 6  . sertraline (ZOLOFT) 50 MG tablet Take 1 tablet (50 mg total) by mouth daily. 90 tablet 1  . triamcinolone cream (KENALOG) 0.1 % Apply 1 application  topically 2 (two) times daily as needed. 30 g 1  . trimethoprim-polymyxin b (POLYTRIM) ophthalmic solution Place 2 drops into the right eye every 4 (four) hours. 10 mL 0  . zonisamide (ZONEGRAN) 100 MG capsule Take 1 capsule (100 mg total) by mouth daily. 30 capsule 5   No current facility-administered medications on file prior to visit.     Past Medical History:  Diagnosis Date  . GAD (generalized anxiety disorder) 04/14/2022  . Headache(784.0)   . Moderately severe recurrent major depression ([place]) 04/14/2022  . Seizures (HCC)    Allergies  Allergen Reactions  . Kiwi Extract     Lip goes numb     Social History   Socioeconomic History  . Marital status: Significant Other    Spouse name: Not on file  . Number of children: Not on file  . Years of education: Not on file  . Highest education level: Not on file  Occupational History  . Not on file  Tobacco Use  . Smoking status: Never  . Smokeless tobacco: Never  Substance and Sexual Activity  . Alcohol use: No  . Drug use: Never  . Sexual activity: Yes    Birth control/protection: Inserts  Other Topics Concern  . Not on file   Social History Narrative   Lindsay Rosario is a recent graduate from [employer]; she attends [employer] and majors in Radiology. She does well in school.   Lives with her grandparents   Social Determinants of Health   Financial Resource Strain: Not on file  Food Insecurity: Not on file  Transportation Needs: Not on file  Physical Activity: Not on file  Stress: Not on file  Social Connections: Not on file    There were no vitals filed for this visit. There is no height or weight on file to calculate BMI.  Physical Exam  ASSESSMENT AND PLAN:  There are no diagnoses linked to this encounter.   No follow-ups on file.   Betty G. Martin[place], MD  [facility]. [place] office.  Discharge Instructions   None

## 2022-07-22 ENCOUNTER — Ambulatory Visit: Payer: No Typology Code available for payment source | Admitting: Family Medicine

## 2022-07-23 NOTE — Progress Notes (Deleted)
    ACUTE VISIT No chief complaint on file.  HPI: [name] is a 21 y.o. female, who is here today complaining of *** HPI  Review of Systems Rest see pertinent positives and negatives per HPI.  Current Outpatient Medications on File Prior to Visit  Medication Sig Dispense Refill  . etonogestrel-ethinyl estradiol (NUVARING) 0.12-0.015 MG/24HR vaginal ring Place 1 each vaginally every 28 (twenty-eight) days. 1 each 12  . fluticasone (FLONASE) 50 MCG/ACT nasal spray Place 2 sprays into both nostrils daily. 16 g 6  . sertraline (ZOLOFT) 50 MG tablet Take 1 tablet (50 mg total) by mouth daily. 90 tablet 1  . triamcinolone cream (KENALOG) 0.1 % Apply 1 application  topically 2 (two) times daily as needed. 30 g 1  . trimethoprim-polymyxin b (POLYTRIM) ophthalmic solution Place 2 drops into the right eye every 4 (four) hours. 10 mL 0  . zonisamide (ZONEGRAN) 100 MG capsule Take 1 capsule (100 mg total) by mouth daily. 30 capsule 5   No current facility-administered medications on file prior to visit.     Past Medical History:  Diagnosis Date  . GAD (generalized anxiety disorder) 04/14/2022  . Headache(784.0)   . Moderately severe recurrent major depression ([place]) 04/14/2022  . Seizures (HCC)    Allergies  Allergen Reactions  . Kiwi Extract     Lip goes numb     Social History   Socioeconomic History  . Marital status: Significant Other    Spouse name: Not on file  . Number of children: Not on file  . Years of education: Not on file  . Highest education level: Not on file  Occupational History  . Not on file  Tobacco Use  . Smoking status: Never  . Smokeless tobacco: Never  Substance and Sexual Activity  . Alcohol use: No  . Drug use: Never  . Sexual activity: Yes    Birth control/protection: Inserts  Other Topics Concern  . Not on file  Social History Narrative   Lindsay Rosario is a recent graduate from [employer]; she attends [employer] and majors in Radiology.  She does well in school.   Lives with her grandparents   Social Determinants of Health   Financial Resource Strain: Not on file  Food Insecurity: Not on file  Transportation Needs: Not on file  Physical Activity: Not on file  Stress: Not on file  Social Connections: Not on file    There were no vitals filed for this visit. There is no height or weight on file to calculate BMI.  Physical Exam  ASSESSMENT AND PLAN:  There are no diagnoses linked to this encounter.   No follow-ups on file.   Betty G. Martinique, MD  [facility]. [place] office.  Discharge Instructions   None

## 2022-07-24 ENCOUNTER — Ambulatory Visit: Payer: No Typology Code available for payment source | Admitting: Family Medicine

## 2022-07-24 NOTE — Progress Notes (Unsigned)
    ACUTE VISIT No chief complaint on file.  HPI: [name] is a 20 y.o. female, who is here today complaining of *** HPI  Review of Systems Rest see pertinent positives and negatives per HPI.  Current Outpatient Medications on File Prior to Visit  Medication Sig Dispense Refill  . etonogestrel-ethinyl estradiol (NUVARING) 0.12-0.015 MG/24HR vaginal ring Place 1 each vaginally every 28 (twenty-eight) days. 1 each 12  . fluticasone (FLONASE) 50 MCG/ACT nasal spray Place 2 sprays into both nostrils daily. 16 g 6  . sertraline (ZOLOFT) 50 MG tablet Take 1 tablet (50 mg total) by mouth daily. 90 tablet 1  . triamcinolone cream (KENALOG) 0.1 % Apply 1 application  topically 2 (two) times daily as needed. 30 g 1  . trimethoprim-polymyxin b (POLYTRIM) ophthalmic solution Place 2 drops into the right eye every 4 (four) hours. 10 mL 0  . zonisamide (ZONEGRAN) 100 MG capsule Take 1 capsule (100 mg total) by mouth daily. 30 capsule 5   No current facility-administered medications on file prior to visit.     Past Medical History:  Diagnosis Date  . GAD (generalized anxiety disorder) 04/14/2022  . Headache(784.0)   . 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 Seizures (HCC)    Allergies  Allergen Reactions  . Kiwi Extract     Lip goes numb     Social History   Socioeconomic History  . Marital status: Significant Other    Spouse name: Not on file  . Number of children: Not on file  . Years of education: Not on file  . Highest education level: Not on file  Occupational History  . Not on file  Tobacco Use  . Smoking status: Never  . Smokeless tobacco: Never  Substance and Sexual Activity  . Alcohol use: No  . Drug use: Never  . Sexual activity: Yes    Birth control/protection: Inserts  Other Topics Concern  . Not on file  Social History Narrative   Naquisha is a recent graduate from [employer]; she attends [employer] and majors in Radiology.  She does well in school.   Lives with her grandparents   Social Determinants of Health   Financial Resource Strain: Not on file  Food Insecurity: Not on file  Transportation Needs: Not on file  Physical Activity: Not on file  Stress: Not on file  Social Connections: Not on file    There were no vitals filed for this visit. There is no height or weight on file to calculate BMI.  Physical Exam  ASSESSMENT AND PLAN:  There are no diagnoses linked to this encounter.   No follow-ups on file.   Tomislav Micale G. Amory Zbikowski, MD  [facility]. [place] office.  Discharge Instructions   None               

## 2022-07-27 ENCOUNTER — Ambulatory Visit (INDEPENDENT_AMBULATORY_CARE_PROVIDER_SITE_OTHER): Payer: No Typology Code available for payment source | Admitting: Family Medicine

## 2022-07-27 ENCOUNTER — Encounter: Payer: Self-pay | Admitting: Family Medicine

## 2022-07-27 VITALS — BP 100/70 | HR 73 | Temp 99.2°F | Resp 12 | Ht 64.0 in | Wt 118.0 lb

## 2022-07-27 DIAGNOSIS — J039 Acute tonsillitis, unspecified: Secondary | ICD-10-CM

## 2022-07-27 DIAGNOSIS — K118 Other diseases of salivary glands: Secondary | ICD-10-CM

## 2022-07-27 DIAGNOSIS — J029 Acute pharyngitis, unspecified: Secondary | ICD-10-CM | POA: Diagnosis not present

## 2022-08-10 NOTE — Progress Notes (Unsigned)
ACUTE VISIT Chief Complaint  Patient presents with   fluid in ears   HPI: Lindsay Rosario is a 21 y.o. female with medical history significant for anxiety, depression, migraine headaches, and B12 deficiency here today complaining of episodes of Aspirin sensation that this started last week. Exacerbated by head movement, bending down, and lying down. No associated symptoms except for mild nausea. Episodes do "not last long."  Dizziness This is a new problem. The current episode started in the past 7 days. The problem has been waxing and waning. Associated symptoms include fatigue, headaches (No more than usual.), nausea and vertigo. Pertinent negatives include no abdominal pain, change in bowel habit, chest pain, chills, congestion, coughing, diaphoresis, fever, joint swelling, myalgias, neck pain, numbness, rash, sore throat, swollen glands, urinary symptoms, visual change, vomiting or weakness. The symptoms are aggravated by bending. She has tried nothing for the symptoms.  Chronic tinnitus unchanged. No changes in hearing. No recent URI or travel.  She is also requesting a sickle cell trait screening. Review of Systems  Constitutional:  Positive for fatigue. Negative for chills, diaphoresis and fever.  HENT:  Negative for congestion and sore throat.   Respiratory:  Negative for cough, shortness of breath and wheezing.   Cardiovascular:  Negative for chest pain and palpitations.  Gastrointestinal:  Positive for nausea. Negative for abdominal pain, change in bowel habit and vomiting.  Musculoskeletal:  Negative for joint swelling, myalgias and neck pain.  Skin:  Negative for rash.  Neurological:  Positive for dizziness, vertigo and headaches (No more than usual.). Negative for syncope, weakness and numbness.  Rest see pertinent positives and negatives per HPI.  Current Outpatient Medications on File Prior to Visit  Medication Sig Dispense Refill   etonogestrel-ethinyl estradiol  (NUVARING) 0.12-0.015 MG/24HR vaginal ring Place 1 each vaginally every 28 (twenty-eight) days. 1 each 12   fluticasone (FLONASE) 50 MCG/ACT nasal spray Place 2 sprays into both nostrils daily. 16 g 6   sertraline (ZOLOFT) 50 MG tablet Take 1 tablet (50 mg total) by mouth daily. 90 tablet 1   triamcinolone cream (KENALOG) 0.1 % Apply 1 application  topically 2 (two) times daily as needed. 30 g 1   trimethoprim-polymyxin b (POLYTRIM) ophthalmic solution Place 2 drops into the right eye every 4 (four) hours. 10 mL 0   zonisamide (ZONEGRAN) 100 MG capsule Take 1 capsule (100 mg total) by mouth daily. 30 capsule 5   No current facility-administered medications on file prior to visit.   Past Medical History:  Diagnosis Date   GAD (generalized anxiety disorder) 04/14/2022   Headache(784.0)    Moderately severe recurrent major depression ([place]) 04/14/2022   Seizures ([place])    Allergies  Allergen Reactions   Kiwi Extract     Lip goes numb    Social History   Socioeconomic History   Marital status: Significant Other    Spouse name: Not on file   Number of children: Not on file   Years of education: Not on file   Highest education level: Not on file  Occupational History   Not on file  Tobacco Use   Smoking status: Never   Smokeless tobacco: Never  Substance and Sexual Activity   Alcohol use: No   Drug use: Never   Sexual activity: Yes    Birth control/protection: Inserts  Other Topics Concern   Not on file  Social History Narrative   Lindsay Rosario is a recent graduate from [employer]; she attends [employer] and majors  in Radiology. She does well in school.   Lives with her grandparents   Social Determinants of Health   Financial Resource Strain: Not on file  Food Insecurity: Not on file  Transportation Needs: Not on file  Physical Activity: Not on file  Stress: Not on file  Social Connections: Not on file   Vitals:   08/11/22 1624  BP: 100/70  Pulse: 66  Resp: 12  SpO2:  98%  Body mass index is 20.25 kg/m.  Physical Exam Vitals and nursing note reviewed.  Constitutional:      General: She is not in acute distress.    Appearance: She is well-developed. She is not ill-appearing.  HENT:     Head: Normocephalic and atraumatic.     Right Ear: Hearing, tympanic membrane, ear canal and external ear normal.     Left Ear: Hearing, tympanic membrane, ear canal and external ear normal.     Ears:     Comments: Apley maneuver and Dix-Hallpike maneuver positive left. Associated minimal nystagmus.     Mouth/Throat:     Mouth: Mucous membranes are moist.     Pharynx: Oropharynx is clear.  Eyes:     Conjunctiva/sclera: Conjunctivae normal.     Pupils: Pupils are equal, round, and reactive to light.  Cardiovascular:     Rate and Rhythm: Normal rate and regular rhythm.     Pulses:          Dorsalis pedis pulses are 2+ on the right side and 2+ on the left side.     Heart sounds: No murmur heard. Pulmonary:     Effort: Pulmonary effort is normal. No respiratory distress.     Breath sounds: Normal breath sounds.  Abdominal:     Palpations: Abdomen is soft. There is no hepatomegaly or mass.     Tenderness: There is no abdominal tenderness.  Musculoskeletal:        General: No tenderness.  Lymphadenopathy:     Cervical: No cervical adenopathy.  Skin:    General: Skin is warm.     Findings: No erythema or rash.  Neurological:     Mental Status: She is alert and oriented to person, place, and time.     Cranial Nerves: No cranial nerve deficit.     Sensory: No sensory deficit.     Coordination: Coordination normal.     Gait: Gait normal.     Comments: Pronator drift negative.  Psychiatric:        Mood and Affect: Mood and affect normal.   ASSESSMENT AND PLAN:  Dema was seen today for fluid in ears.  Diagnoses and all orders for this visit: Orders Placed This Encounter  Procedures   Sickle Cell Screen   Benign paroxysmal positional vertigo,  unspecified laterality We discussed other possible etiologies of dizziness, Hx and examination today suggest benign vertigo. I do not think further work-up is necessary at this time but needs to be consider if worsening or persient symptoms for more that 2 weeks; in this case ENT will be considered. Explained that problem can be recurrent. Fall prevention. Vestibular exercises recommended, handout with [place] maneuvers given. Meclizine 25 mg tid prn, some side effects discussed. Instructed about warning signs. F/U as needed.  -     meclizine (ANTIVERT) 25 MG tablet; Take 1 tablet (25 mg total) by mouth 3 (three) times daily as needed for dizziness.  Encounter for sickle-cell screening -     Sickle Cell Screen; Future  Return if symptoms  worsen or fail to improve.   Taequan Stockhausen G. Martinique, MD  [facility]. [place] office.

## 2022-08-11 ENCOUNTER — Encounter: Payer: Self-pay | Admitting: Family Medicine

## 2022-08-11 ENCOUNTER — Ambulatory Visit (INDEPENDENT_AMBULATORY_CARE_PROVIDER_SITE_OTHER): Payer: No Typology Code available for payment source | Admitting: Family Medicine

## 2022-08-11 VITALS — BP 100/70 | HR 66 | Resp 12 | Ht 64.0 in | Wt 118.0 lb

## 2022-08-11 DIAGNOSIS — Z130 Encounter for screening for diseases of the blood and blood-forming organs and certain disorders involving the immune mechanism: Secondary | ICD-10-CM

## 2022-08-11 DIAGNOSIS — H8110 Benign paroxysmal vertigo, unspecified ear: Secondary | ICD-10-CM | POA: Diagnosis not present

## 2022-08-11 MED ORDER — MECLIZINE HCL 25 MG PO TABS: 25.0000 mg | ORAL_TABLET | Freq: Three times a day (TID) | ORAL | 0 refills | Status: DC | PRN

## 2022-08-13 ENCOUNTER — Encounter (HOSPITAL_BASED_OUTPATIENT_CLINIC_OR_DEPARTMENT_OTHER): Payer: Self-pay | Admitting: Emergency Medicine

## 2022-08-13 ENCOUNTER — Emergency Department (HOSPITAL_BASED_OUTPATIENT_CLINIC_OR_DEPARTMENT_OTHER)
Admission: EM | Admit: 2022-08-13 | Discharge: 2022-08-13 | Disposition: A | Discharge status: Home / Self Care | Payer: No Typology Code available for payment source | Attending: Student | Admitting: Student

## 2022-08-13 ENCOUNTER — Other Ambulatory Visit: Payer: Self-pay

## 2022-08-13 DIAGNOSIS — G40909 Epilepsy, unspecified, not intractable, without status epilepticus: Secondary | ICD-10-CM | POA: Insufficient documentation

## 2022-08-13 DIAGNOSIS — R55 Syncope and collapse: Secondary | ICD-10-CM | POA: Diagnosis present

## 2022-08-13 DIAGNOSIS — R569 Unspecified convulsions: Secondary | ICD-10-CM

## 2022-08-13 LAB — COMPREHENSIVE METABOLIC PANEL
ALT: 20 U/L (ref 0–44)
AST: 20 U/L (ref 15–41)
Albumin: 4.2 g/dL (ref 3.5–5.0)
Alkaline Phosphatase: 68 U/L (ref 38–126)
Anion gap: 9 (ref 5–15)
BUN: 8 mg/dL (ref 6–20)
CO2: 25 mmol/L (ref 22–32)
Calcium: 9.1 mg/dL (ref 8.9–10.3)
Chloride: 102 mmol/L (ref 98–111)
Creatinine, Ser: 0.71 mg/dL (ref 0.44–1.00)
GFR, Estimated: >60 mL/min (ref >60)
Glucose, Bld: 90 mg/dL (ref 70–99)
Potassium: 3.6 mmol/L (ref 3.5–5.1)
Sodium: 136 mmol/L (ref 135–145)
Total Bilirubin: 0.4 mg/dL (ref 0.3–1.2)
Total Protein: 7.2 g/dL (ref 6.5–8.1)

## 2022-08-13 LAB — CBC
HCT: 37.5 % (ref 36.0–46.0)
Hemoglobin: 12.8 g/dL (ref 12.0–15.0)
MCH: 29.3 pg (ref 26.0–34.0)
MCHC: 34.1 g/dL (ref 30.0–36.0)
MCV: 85.8 fL (ref 80.0–100.0)
Platelets: 223 10*3/uL (ref 150–400)
RBC: 4.37 MIL/uL (ref 3.87–5.11)
RDW: 11.8 % (ref 11.5–15.5)
WBC: 7.8 10*3/uL (ref 4.0–10.5)
nRBC: 0 % (ref 0.0–0.2)

## 2022-08-13 LAB — HCG, SERUM, QUALITATIVE: Preg, Serum: NEGATIVE

## 2022-08-13 LAB — URINALYSIS, ROUTINE W REFLEX MICROSCOPIC
Bilirubin Urine: NEGATIVE
Glucose, UA: NEGATIVE mg/dL
Hgb urine dipstick: NEGATIVE
Ketones, ur: NEGATIVE mg/dL
Leukocytes,Ua: NEGATIVE
Nitrite: NEGATIVE
Protein, ur: NEGATIVE mg/dL
Specific Gravity, Urine: 1.007 (ref 1.005–1.030)
pH: 5 (ref 5.0–8.0)

## 2022-08-13 LAB — CBG MONITORING, ED: Glucose-Capillary: 89 mg/dL (ref 70–99)

## 2022-08-13 NOTE — ED Provider Notes (Signed)
[facility] EMERGENCY DEPT Provider Note   CSN: [id number] Arrival date & time: 08/13/22  1350     History  Chief Complaint  Patient presents with   Loss of Consciousness    Lindsay Rosario is a 21 y.o. female.   Loss of Consciousness  Patient is a 21 year old female with past medical history significant for seizures, migraines, anxiety, depression  Patient is presented emergency room today with chief complaint of loss of consciousness.  It seems that she has a history of seizures and she states that she suspects that she had a seizure today.  She states around 1230 she clocked out of work and states that this is lasting she remembers.  She woke up on the ground.  She denies any bowel or bladder incontinence.  Notably patient started on zonisamide for migraines.  She states she has been off lamotrigine which was a seizure medication she took in the past for at least 3 years.  There was a period of time where patient was off lamotrigine but did not yet started zonisamide .   Of note patient states that she stopped taking zonisamide approximately 1 week ago because she had not gotten around to going to the pharmacy.    Home Medications Prior to Admission medications   Medication Sig Start Date End Date Taking? Authorizing Provider  etonogestrel-ethinyl estradiol (NUVARING) 0.12-0.015 MG/24HR vaginal ring Place 1 each vaginally every 28 (twenty-eight) days. 06/03/22   [place], Betty G, MD  fluticasone [facility]) 50 MCG/ACT nasal spray Place 2 sprays into both nostrils daily. 09/11/21   Nafziger, Kandee Keen, NP  meclizine (ANTIVERT) 25 MG tablet Take 1 tablet (25 mg total) by mouth 3 (three) times daily as needed for dizziness. 08/11/22   [place], Betty G, MD  sertraline (ZOLOFT) 50 MG tablet Take 1 tablet (50 mg total) by mouth daily. 06/23/22   [place], Betty G, MD  triamcinolone cream (KENALOG) 0.1 % Apply 1 application  topically 2 (two) times daily as needed. 04/14/22    [place], Betty G, MD  trimethoprim-polymyxin b Joaquim Lai) ophthalmic solution Place 2 drops into the right eye every 4 (four) hours. 07/03/22   Burchette, Elberta Fortis, MD  zonisamide (ZONEGRAN) 100 MG capsule Take 1 capsule (100 mg total) by mouth daily. 02/16/22   Lindsay Dallas, DO      Allergies    Kiwi extract    Review of Systems   Review of Systems  Cardiovascular:  Positive for syncope.    Physical Exam Updated Vital Signs BP 114/73   Pulse 71   Temp 98.4 F (36.9 C) (Oral)   Resp 11   LMP 08/06/2022 (Exact Date)   SpO2 100%  Physical Exam Vitals and nursing note reviewed.  Constitutional:      General: She is not in acute distress. HENT:     Head: Normocephalic and atraumatic.     Nose: Nose normal.  Eyes:     General: No scleral icterus. Cardiovascular:     Rate and Rhythm: Normal rate and regular rhythm.     Pulses: Normal pulses.     Heart sounds: Normal heart sounds.  Pulmonary:     Effort: Pulmonary effort is normal. No respiratory distress.     Breath sounds: No wheezing.  Abdominal:     Palpations: Abdomen is soft.     Tenderness: There is no abdominal tenderness.  Musculoskeletal:     Cervical back: Normal range of motion.     Right lower leg: No edema.  Left lower leg: No edema.  Skin:    General: Skin is warm and dry.     Capillary Refill: Capillary refill takes less than 2 seconds.  Neurological:     Mental Status: She is alert. Mental status is at baseline.     Comments: Alert and oriented to self, place, time and event.   Speech is fluent, clear without dysarthria or dysphasia.   Strength 5/5 in upper/lower extremities   Sensation intact in upper/lower extremities   Normal gait.   CN I not tested  CN II grossly intact visual fields bilaterally. Did not visualize posterior eye.  CN III, IV, VI PERRLA and EOMs intact bilaterally  CN V Intact sensation to sharp and light touch to the face  CN VII facial movements symmetric  CN VIII not tested   CN IX, X no uvula deviation, symmetric rise of soft palate  CN XI 5/5 SCM and trapezius strength bilaterally  CN XII Midline tongue protrusion, symmetric L/R movements     Psychiatric:        Mood and Affect: Mood normal.        Behavior: Behavior normal.     ED Results / Procedures / Treatments   Labs (all labs ordered are listed, but only abnormal results are displayed) Labs Reviewed  URINALYSIS, ROUTINE W REFLEX MICROSCOPIC - Abnormal; Notable for the following components:      Result Value   Color, Urine COLORLESS (*)    All other components within normal limits  CBC  HCG, SERUM, QUALITATIVE  COMPREHENSIVE METABOLIC PANEL  CBG MONITORING, ED    EKG None  Radiology No results found.  Procedures Procedures    Medications Ordered in ED Medications - No data to display  ED Course/ Medical Decision Making/ A&P Clinical Course as of 08/13/22 2205  Thu Aug 13, 2022  1648 IMPRESSION: Normal brain MRI with no acute intracranial pathology or epileptogenic focus identified.     Electronically Signed   By: Lindsay Rosario M.D.   On: 12/22/2021 08:35 [WF]  1719 Discussed with Lindsay Rosario who is in agreement with plan to re-start zonisamide today and FU w neurology and no driving for 6 months.  [WF]    Clinical Course User Index [WF] Lindsay Rosario, [place]                           Medical Decision Making Amount and/or Complexity of Data Reviewed Labs: ordered.   This patient presents to the ED for concern of seizure versus syncope, this involves a number of treatment options, and is a complaint that carries with it a moderate to high risk of complications and morbidity.  The differential diagnosis includes most likely seizure.  History of similar and feels that it was consistent with seizures in the past.  Anemia, arrhythmia, hypoglycemia all considered as well.   Co morbidities: Discussed in HPI   Brief History:  Patient is a 21 year old female with past  medical history significant for seizures, migraines, anxiety, depression  Patient is presented emergency room today with chief complaint of loss of consciousness.  It seems that she has a history of seizures and she states that she suspects that she had a seizure today.  She states around 1230 she clocked out of work and states that this is lasting she remembers.  She woke up on the ground.  She denies any bowel or bladder incontinence.  Notably patient started on zonisamide  for migraines.  She states she has been off lamotrigine which was a seizure medication she took in the past for at least 3 years.  There was a period of time where patient was off lamotrigine but did not yet started zonisamide .  Of note patient states that she stopped taking zonisamide approximately 1 week ago because she had not gotten around to going to the pharmacy.  Physical exam unremarkable.    EMR reviewed including pt PMHx, past surgical history and past visits to ER.   See HPI for more details   Lab Tests:   I personally reviewed all laboratory work and imaging. Metabolic panel without any acute abnormality specifically kidney function within normal limits and no significant electrolyte abnormalities. CBC without leukocytosis or significant anemia.   Imaging Studies:  I viewed images from MRI 12/21/2021 this was unremarkable.  Low suspicion for intracranial mass.  We will hold off on CT scan today.   Cardiac Monitoring:  The patient was maintained on a cardiac monitor.  I personally viewed and interpreted the cardiac monitored which showed an underlying rhythm of: NSR EKG non-ischemic   Medicines ordered:     Critical Interventions:     Consults/Attending Physician   I requested consultation with neurology and discussed with Dr. Quinn Axe,  and discussed lab and imaging findings as well as pertinent plan - they recommend: Restarting zonisamide, no driving for 6 months, follow-up with neurology,  return precautions   Reevaluation:  After the interventions noted above I re-evaluated patient and found that they have :stayed the same no symptoms   Social Determinants of Health:      Problem List / ED Course:  Likely seizure.  Recent MRI brain without masses.  Discussed with neurology will restart on zonisamide and she will follow-up with neurology.  Return precautions discussed.  No driving or operating heavy machinery for 6 months   Dispostion:  After consideration of the diagnostic results and the patients response to treatment, I feel that the patent would benefit from outpatient neurology follow-up.   Final Clinical Impression(s) / ED Diagnoses Final diagnoses:  Seizure [facility])    Rx / [place] Orders ED Discharge Orders     None         Tedd Sias, [place] 08/13/22 2208    Malvin Johns, MD 08/13/22 2345

## 2022-08-13 NOTE — Discharge Instructions (Signed)
Restart the zonisamide and take nightly.  Hydrate and avoid sleep deprivation by resting 7 to 8 hours per night.  Make sure you are eating regular meals and follow-up with your neurologist.  Return to the ER for any new or concerning symptoms.  You will need to refrain from driving, swimming, operating heavy machinery for 6 months or until instructed otherwise by your neurologist

## 2022-08-13 NOTE — ED Notes (Signed)
Reviewed AVS/discharge instruction with patient. Time allotted for and all questions answered. Patient is agreeable for d/c and escorted to ed exit by staff.  

## 2022-08-13 NOTE — ED Triage Notes (Addendum)
Pt arrives to ED with c/o loss of consciousness that occurred today at 1230. Pt reports that she clocked out of work and walked outside and the next thing she knew she was waking up on the ground. She does note hx of seizure with last in high school. There was no bystanders. EMS reports that pt was post ictal upon arrival, pt was unable to tell paramedics her birthday and when her last seizure medications.

## 2022-08-14 ENCOUNTER — Telehealth: Payer: Self-pay | Admitting: Neurology

## 2022-08-14 NOTE — Telephone Encounter (Signed)
Pt ended up in the ER for a seizure. They told her to see dr.jaffe for this since she has not had a seizure in quite some time. Her appt is in dec, does she need to come sooner?

## 2022-08-17 ENCOUNTER — Other Ambulatory Visit: Payer: Self-pay

## 2022-08-17 ENCOUNTER — Encounter: Payer: Self-pay | Admitting: Neurology

## 2022-08-17 ENCOUNTER — Telehealth (INDEPENDENT_AMBULATORY_CARE_PROVIDER_SITE_OTHER): Payer: No Typology Code available for payment source | Admitting: Neurology

## 2022-08-17 VITALS — Ht 65.0 in | Wt 120.0 lb

## 2022-08-17 DIAGNOSIS — G44219 Episodic tension-type headache, not intractable: Secondary | ICD-10-CM | POA: Diagnosis not present

## 2022-08-17 DIAGNOSIS — G40909 Epilepsy, unspecified, not intractable, without status epilepticus: Secondary | ICD-10-CM | POA: Diagnosis not present

## 2022-08-17 MED ORDER — ZONISAMIDE 100 MG PO CAPS
300.0000 mg | ORAL_CAPSULE | Freq: Every day | ORAL | 2 refills | Status: DC
  Filled 2022-12-11: qty 90, 30d supply, fill #0
  Filled 2023-03-18: qty 90, 30d supply, fill #1
  Filled 2023-05-04: qty 90, 30d supply, fill #2

## 2022-08-17 NOTE — Patient Instructions (Signed)
  1. Increase zonidamide 100mg  to 2 pills daily for one week, then 3 pills daily  2. Avoid activities in which a seizure would cause danger to yourself or to others.  Don't operate dangerous machinery, swim alone, or climb in high or dangerous places, such as on ladders, roofs, or girders.  Do not drive unless your doctor says you may.  3. If you have any warning that you may have a seizure, lay down in a safe place where you can't hurt yourself.    4.  No driving for 6 months from last seizure, as per [facility].   Please refer to the following link on the [place] website for more information: http://www.epilepsyfoundation.org/answerplace/Social/driving/drivingu.cfm   5.  Maintain good sleep hygiene.  6.  Notify your neurology if you are planning pregnancy or if you become pregnant.  7.  Contact your doctor if you have any problems that may be related to the medicine you are taking.  8.  Call 911 and bring the patient back to the ED if:        A.  The seizure lasts longer than 5 minutes.       B.  The patient doesn't awaken shortly after the seizure  C.  The patient has new problems such as difficulty seeing, speaking or moving  D.  The patient was injured during the seizure  E.  The patient has a temperature over 102 F (39C)  F.  The patient vomited and now is having trouble breathing
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
## 2022-08-17 NOTE — Progress Notes (Signed)
ERROR

## 2022-08-28 ENCOUNTER — Other Ambulatory Visit: Payer: Self-pay | Admitting: Neurology

## 2022-09-15 ENCOUNTER — Other Ambulatory Visit: Payer: Self-pay

## 2022-09-15 DIAGNOSIS — K529 Noninfective gastroenteritis and colitis, unspecified: Secondary | ICD-10-CM

## 2022-09-16 ENCOUNTER — Encounter: Payer: Self-pay | Admitting: Internal Medicine

## 2022-10-05 NOTE — Progress Notes (Deleted)
   ACUTE VISIT No chief complaint on file.  HPI: [name] is a 21 y.o. female, who is here today complaining of *** HPI  Review of Systems See other pertinent positives and negatives in HPI.  Current Outpatient Medications on File Prior to Visit  Medication Sig Dispense Refill   etonogestrel-ethinyl estradiol (NUVARING) 0.12-0.015 MG/24HR vaginal ring Place 1 each vaginally every 28 (twenty-eight) days. 1 each 12   fluticasone (FLONASE) 50 MCG/ACT nasal spray Place 2 sprays into both nostrils daily. 16 g 6   sertraline (ZOLOFT) 50 MG tablet Take 1 tablet (50 mg total) by mouth daily. 90 tablet 1   triamcinolone cream (KENALOG) 0.1 % Apply 1 application  topically 2 (two) times daily as needed. 30 g 1   zonisamide (ZONEGRAN) 100 MG capsule Take 3 capsules (300 mg total) by mouth daily. 90 capsule 5   No current facility-administered medications on file prior to visit.    Past Medical History:  Diagnosis Date   GAD (generalized anxiety disorder) 04/14/2022   Headache(784.0)    Moderately severe recurrent major depression (HCC) 04/14/2022   Seizures (HCC)    Allergies  Allergen Reactions   Kiwi Extract     Lip goes numb     Social History   Socioeconomic History   Marital status: Significant Other    Spouse name: Not on file   Number of children: Not on file   Years of education: Not on file   Highest education level: Not on file  Occupational History   Not on file  Tobacco Use   Smoking status: Never   Smokeless tobacco: Never  Substance and Sexual Activity   Alcohol use: No   Drug use: Never   Sexual activity: Yes    Birth control/protection: Inserts  Other Topics Concern   Not on file  Social History Narrative   Lindsay Rosario is a recent graduate from [employer]; 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 She does well in school.   Lives with her grandparents   Social Determinants of Health   Financial Resource Strain: Not on file  Food  Insecurity: Not on file  Transportation Needs: Not on file  Physical Activity: Not on file  Stress: Not on file  Social Connections: Not on file    There were no vitals filed for this visit. There is no height or weight on file to calculate BMI.  Physical Exam  ASSESSMENT AND PLAN: There are no diagnoses linked to this encounter.  No follow-ups on file.  Lindsay G. [place], MD  [facility]. [place] office.  Discharge Instructions   None

## 2022-10-06 ENCOUNTER — Ambulatory Visit: Payer: No Typology Code available for payment source | Admitting: Family Medicine

## 2022-10-13 ENCOUNTER — Other Ambulatory Visit: Payer: Self-pay | Admitting: Nurse Practitioner

## 2022-10-13 DIAGNOSIS — Z3044 Encounter for surveillance of vaginal ring hormonal contraceptive device: Secondary | ICD-10-CM

## 2022-10-14 ENCOUNTER — Ambulatory Visit: Payer: No Typology Code available for payment source | Admitting: Neurology

## 2022-10-21 ENCOUNTER — Encounter: Payer: Self-pay | Admitting: Internal Medicine

## 2022-10-21 ENCOUNTER — Ambulatory Visit (INDEPENDENT_AMBULATORY_CARE_PROVIDER_SITE_OTHER)
Admission: RE | Admit: 2022-10-21 | Discharge: 2022-10-21 | Disposition: A | Discharge status: Home / Self Care | Payer: No Typology Code available for payment source | Source: Ambulatory Visit | Attending: Internal Medicine | Admitting: Internal Medicine

## 2022-10-21 ENCOUNTER — Ambulatory Visit (INDEPENDENT_AMBULATORY_CARE_PROVIDER_SITE_OTHER): Payer: No Typology Code available for payment source | Admitting: Internal Medicine

## 2022-10-21 ENCOUNTER — Other Ambulatory Visit (INDEPENDENT_AMBULATORY_CARE_PROVIDER_SITE_OTHER): Payer: No Typology Code available for payment source

## 2022-10-21 VITALS — BP 100/60 | HR 98 | Ht 65.5 in | Wt 111.0 lb

## 2022-10-21 DIAGNOSIS — R109 Unspecified abdominal pain: Secondary | ICD-10-CM

## 2022-10-21 DIAGNOSIS — R197 Diarrhea, unspecified: Secondary | ICD-10-CM

## 2022-10-21 DIAGNOSIS — R634 Abnormal weight loss: Secondary | ICD-10-CM

## 2022-10-21 DIAGNOSIS — K219 Gastro-esophageal reflux disease without esophagitis: Secondary | ICD-10-CM

## 2022-10-21 DIAGNOSIS — R152 Fecal urgency: Secondary | ICD-10-CM | POA: Diagnosis not present

## 2022-10-21 DIAGNOSIS — Z130 Encounter for screening for diseases of the blood and blood-forming organs and certain disorders involving the immune mechanism: Secondary | ICD-10-CM

## 2022-10-21 LAB — C-REACTIVE PROTEIN: CRP: <1 mg/dL (ref 0.5–20.0)

## 2022-10-21 MED ORDER — DICYCLOMINE HCL 10 MG PO CAPS
10.0000 mg | ORAL_CAPSULE | Freq: Three times a day (TID) | ORAL | 1 refills | Status: DC | PRN
  Filled 2022-12-11: qty 90, 30d supply, fill #0

## 2022-10-21 NOTE — Patient Instructions (Signed)
If you are age 22 or younger, your body mass index should be between 19-25. Your Body mass index is 18.19 kg/m. If this is out of the aformentioned range listed, please consider follow up with your Primary Care Provider.  ________________________________________________________  The [facility] providers would like to encourage you to use [facility] to communicate with providers for non-urgent requests or questions.  Due to long hold times on the telephone, sending your provider a message by [facility] may be a faster and more efficient way to get a response.  Please allow 48 business hours for a response.  Please remember that this is for non-urgent requests.  _______________________________________________________  We have sent the following medications to your pharmacy for you to pick up at your convenience:  START: Bentyl 10mg  one tablet three times daily as needed for abdominal pain.  Your provider has requested that you go to the basement level for lab work before leaving today. Press "B" on the elevator. The lab is located at the first door on the left as you exit the elevator.  Your provider has requested that you have an abdominal x ray before leaving today. Please go to the basement floor to our Radiology department for the test.  Due to recent changes in healthcare laws, you may see the results of your imaging and laboratory studies on MyChart before your provider has had a chance to review them.  We understand that in some cases there may be results that are confusing or concerning to you. Not all laboratory results come back in the same time frame and the provider may be waiting for multiple results in order to interpret others.  Please give 48 hours in order for your provider to thoroughly review all the results before contacting the office for clarification of your results.   You are scheduled to follow up on 12-03-22 at 8:30am.  Thank you for entrusting me with your care and choosing  [facility].  Dr [facility]

## 2022-10-21 NOTE — Progress Notes (Signed)
Chief Complaint: Diarrhea  HPI : 21 year old female with history of seizures, headache, depression, and anxiety presents with diarrhea  She started developing diarrhea in 07/2022. This diarrhea has persisted. The diarrhea can vary in severity. Sometimes she will have no BMs and other days she will have 5-6 BMs in a day. She feels like all foods upset her stomach. She used to be only sensitive to dairy, which she suspected was due to lactose intolerance. Sometimes she feels like she is straining over the toilet but nothing comes out. She has fecal urgency. Denies blood in stools. She has lost 7 lbs since 07/2022 because everything that she eats upsets her stomach. Endorses nocturnal stools. She has had history of constipation in the past. Endorses ab cramping in the lower abdomen after she eats. After she has a BM, she will feel a pressure release but the pain can persist. Eventually the ab pain goes away over time. She has heartburn with chest burning and regurgitation starting 2-3 weeks ago. She had to take liquid antacid, which did help with the reflux. Denies dysphagia, N&V, or blood in the stools. Aunt has IBS. Last seizure was 08/2022 when she had her zonisamide increased. She eats mostly Special K cereal because this does not cause her to develop ab pain. 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  Wt Readings from Last 3 Encounters:  10/21/22 111 lb (50.3 kg)  08/17/22 120 lb (54.4 kg)  08/11/22 118 lb (53.5 kg)   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   Seizures (HCC)    Past Surgical History:  Procedure Laterality Date   MOUTH SURGERY     wisdom teeth removed x 2   Family History  Problem Relation Age of Onset   Migraines Mother    Migraines Brother    Irritable  bowel syndrome Maternal Aunt    Colon cancer Neg Hx    Esophageal cancer Neg Hx    Social History   Tobacco Use   Smoking status: Never   Smokeless tobacco: Never  Vaping Use   Vaping Use: Never used  Substance Use Topics   Alcohol use: No   Drug use: Never   Current Outpatient Medications  Medication Sig Dispense Refill   etonogestrel-ethinyl estradiol (NUVARING) 0.12-0.015 MG/24HR vaginal ring PLACE 1 EACH VAGINALLY EVERY 28 (TWENTY-EIGHT) DAYS. 3 each 3   fluticasone (FLONASE) 50 MCG/ACT nasal spray Place 2 sprays into both nostrils daily. 16 g 6   sertraline (ZOLOFT) 50 MG tablet Take 1 tablet (50 mg total) by mouth daily. 90 tablet 1   triamcinolone cream (KENALOG) 0.1 % Apply 1 application  topically 2 (two) times daily as needed. 30 g 1   zonisamide (ZONEGRAN) 100 MG capsule Take 3 capsules (300 mg total) by mouth daily. 90 capsule 5   No current facility-administered medications for this visit.   Allergies  Allergen Reactions   Kiwi Extract     Lip goes numb    Review of Systems: All systems reviewed and negative except where noted in HPI.   Physical Exam: BP 100/60   Pulse 98   Ht 5' 5.5" (1.664 m)   Wt 111 lb (50.3 kg)   BMI 18.19 kg/m  Constitutional: Pleasant,well-developed, female in no acute distress.  HEENT: Normocephalic and atraumatic. Conjunctivae are normal. No scleral icterus. Cardiovascular: Normal rate, regular rhythm.  Pulmonary/chest: Effort normal and breath sounds normal. No wheezing, rales or rhonchi. Abdominal: Soft, nondistended, nontender. Bowel sounds active throughout. There are no masses palpable. No hepatomegaly. Extremities: No edema Neurological: Alert and oriented to person place and time. Skin: Skin is warm and dry. No rashes noted. Psychiatric: Normal mood and affect. Behavior is normal.  Labs 05/2022: TSH nml.   Labs 08/2022: CBC and CMP nml.   ASSESSMENT AND PLAN: Diarrhea Fecal urgency Lower abdominal pain GERD Weight  loss Patient presents with diarrhea over the last 3 months. This diarrhea worsens after eating and is associated with some lower ab pain. I encouraged her to try a daily fiber supplement to see if this will help with regulating her bowel habits. Patient could be experiencing overflow diarrhea so will check a KUB to evaluate for underlying stool burden. Will also rule out other causes of diarrhea by performing labs. Will trial her on dicyclomine therapy to see if this helps with her symptoms.  - Encourage daily fiber supplement such as Benefiber or Metamucil - Check CRP, TTG IgA, IgA, H pylori antibody - Check KUB - Start dicyclomine 10 mg TID PRN - RTC 6 weeks  Eulah Pont, MD  I spent 48 minutes of time, including in depth chart review, independent review of results as outlined above, communicating results with the patient directly, face-to-face time with the patient, coordinating care, ordering studies and medications as appropriate, and documentation.

## 2022-10-22 ENCOUNTER — Encounter: Payer: Self-pay | Admitting: Internal Medicine

## 2022-10-22 LAB — TISSUE TRANSGLUTAMINASE, IGA: (tTG) Ab, IgA: <1 U/mL

## 2022-10-22 LAB — SICKLE CELL SCREEN: Sickle Solubility Test - HGBRFX: NEGATIVE

## 2022-10-22 LAB — IGA: Immunoglobulin A: 272 mg/dL (ref 47–310)

## 2022-10-23 ENCOUNTER — Other Ambulatory Visit: Payer: Self-pay

## 2022-10-23 DIAGNOSIS — R109 Unspecified abdominal pain: Secondary | ICD-10-CM

## 2022-10-23 DIAGNOSIS — K219 Gastro-esophageal reflux disease without esophagitis: Secondary | ICD-10-CM

## 2022-11-20 ENCOUNTER — Ambulatory Visit (INDEPENDENT_AMBULATORY_CARE_PROVIDER_SITE_OTHER): Payer: Medicaid Other | Admitting: Family Medicine

## 2022-11-20 VITALS — Ht 65.5 in

## 2022-11-20 DIAGNOSIS — J029 Acute pharyngitis, unspecified: Secondary | ICD-10-CM

## 2022-11-20 LAB — POCT RAPID STREP A (OFFICE): Rapid Strep A Screen: NEGATIVE

## 2022-11-20 LAB — POC COVID19 BINAXNOW: SARS Coronavirus 2 Ag: NEGATIVE

## 2022-11-20 MED ORDER — AMOXICILLIN 875 MG PO TABS: 875.0000 mg | ORAL_TABLET | Freq: Two times a day (BID) | ORAL | 0 refills | Status: AC

## 2022-11-20 NOTE — Progress Notes (Signed)
Acute Office Visit  Subjective:     Patient ID: Lindsay Rosario, female    DOB: 04-13-2001, 22 y.o.   MRN: [id number]  Chief Complaint  Patient presents with   Sore Throat   Cough  I connected with  Lindsay Rosario on 11/20/22 by a video enabled telemedicine application and verified that I am speaking with the correct person using two identifiers.   I discussed the limitations of evaluation and management by telemedicine. The patient expressed understanding and agreed to proceed.   Patient location: in her car in the parking lot of [employer] office  Provider location: [employer] office.   Sore Throat  This is a new problem. The current episode started yesterday. The problem has been gradually worsening. Neither side of throat is experiencing more pain than the other. Associated symptoms include coughing.  Cough   Patient is in today for new onset of sore throat, states that it started yesterday and has worsened somewhat today. Pt reports she started developing mucus a little this morning, no fever/chills, feels sore when she swallows. Covid and strep and both are negative. Pt is reporting some coughing, feels like something is stuck in her throat and needs to cough it up.   Review of Systems  Respiratory:  Positive for cough.         Objective:    Ht 5' 5.5" (1.664 m)   LMP 10/07/2022   BMI 18.19 kg/m    Physical Exam Vitals reviewed.  Constitutional:      Appearance: She is well-developed and normal weight.  Pulmonary:     Effort: Pulmonary effort is normal.  Neurological:     Mental Status: She is alert and oriented to person, place, and time.  Psychiatric:        Mood and Affect: Mood normal.     Results for orders placed or performed in visit on 11/20/22  POC COVID-19  Result Value Ref Range   SARS Coronavirus 2 Ag Negative Negative  POC Rapid Strep A  Result Value Ref Range   Rapid Strep A Screen Negative Negative         Assessment & Plan:   Problem List Items Addressed This Visit   None Visit Diagnoses     Sore throat    -  Primary   Relevant Orders   POC COVID-19 (Completed)   POC Rapid Strep A (Completed)   Acute pharyngitis, unspecified etiology       Relevant Medications   amoxicillin (AMOXIL) 875 MG tablet     Most likely viral, her strep screen and COVID testing was negative today. Patient states she is going out of the country in 2 days, is very worried that her symptoms will worsen once she is on her trip. I advised that most likely she will start getting better around day 5 of her illness, she may use throat lozenges and salt water gargles to help with the discomfort. Since she is leaving the country and will not have access to care will go ahead and call in amoxicillin 875 mg BID-- pt advised to only take this if her symptoms worsen or persistt beyond 7 days.   Meds ordered this encounter  Medications   amoxicillin (AMOXIL) 875 MG tablet    Sig: Take 1 tablet (875 mg total) by mouth 2 (two) times daily for 7 days.    Dispense:  14 tablet    Refill:  0    No follow-ups on file.  Farrel Conners, MD

## 2022-12-03 ENCOUNTER — Ambulatory Visit: Payer: 59 | Admitting: Internal Medicine

## 2022-12-10 ENCOUNTER — Other Ambulatory Visit (HOSPITAL_COMMUNITY): Payer: Self-pay

## 2022-12-10 DIAGNOSIS — F411 Generalized anxiety disorder: Secondary | ICD-10-CM | POA: Diagnosis not present

## 2022-12-11 ENCOUNTER — Other Ambulatory Visit (HOSPITAL_COMMUNITY): Payer: Self-pay

## 2022-12-11 MED ORDER — AMOXICILLIN 875 MG PO TABS
875.0000 mg | ORAL_TABLET | Freq: Two times a day (BID) | ORAL | 0 refills | Status: DC
  Filled 2022-12-11: qty 14, 7d supply, fill #0

## 2022-12-11 MED FILL — Etonogestrel-Ethinyl Estradiol VA Ring 0.12-0.015 MG/24HR: VAGINAL | 84 days supply | Qty: 3 | Fill #0 | Status: AC

## 2022-12-17 DIAGNOSIS — F411 Generalized anxiety disorder: Secondary | ICD-10-CM | POA: Diagnosis not present
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
## 2022-12-28 ENCOUNTER — Ambulatory Visit (INDEPENDENT_AMBULATORY_CARE_PROVIDER_SITE_OTHER): Payer: Medicaid Other | Admitting: Neurology

## 2022-12-28 ENCOUNTER — Encounter: Payer: Self-pay | Admitting: Neurology

## 2022-12-28 VITALS — BP 96/67 | HR 120 | Ht 65.0 in | Wt 110.5 lb

## 2022-12-28 DIAGNOSIS — G44219 Episodic tension-type headache, not intractable: Secondary | ICD-10-CM

## 2022-12-28 DIAGNOSIS — G40909 Epilepsy, unspecified, not intractable, without status epilepticus: Secondary | ICD-10-CM

## 2022-12-28 NOTE — Patient Instructions (Signed)
Zonisamide '300mg'$  daily

## 2022-12-29 NOTE — Progress Notes (Deleted)
ACUTE VISIT No chief complaint on file.  HPI: [name] is a 22 y.o. female, who is here today complaining of *** HPI  Review of Systems See other pertinent positives and negatives in HPI.  Current Outpatient Medications on File Prior to Visit  Medication Sig Dispense Refill   dicyclomine (BENTYL) 10 MG capsule Take 1 capsule (10 mg total) by mouth 3 (three) times daily as needed for spasms. 90 capsule 1   etonogestrel-ethinyl estradiol (NUVARING) 0.12-0.015 MG/24HR vaginal ring PLACE 1 RING VAGINALLY EVERY 28 (TWENTY-EIGHT) DAYS. 3 each 2   fluticasone (FLONASE) 50 MCG/ACT nasal spray Place 2 sprays into both nostrils daily. 16 g 6   sertraline (ZOLOFT) 50 MG tablet Take 1 tablet (50 mg total) by mouth daily. 30 tablet 0   triamcinolone cream (KENALOG) 0.1 % Apply 1 application  topically 2 (two) times daily as needed. 30 g 1   zonisamide (ZONEGRAN) 100 MG capsule Take 3 capsules (300 mg total) by mouth daily. 90 capsule 2   No current facility-administered medications on file prior to visit.    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   Seizures ([facility])    Allergies  Allergen Reactions   Kiwi Extract     Lip goes numb     Social History   Socioeconomic History   Marital status: Significant Other    Spouse name: Not on file   Number of children: 0   Years of education: Not on file   Highest education level: 12th grade  Occupational History   Occupation: [employer]    Comment: Public house manager  Tobacco Use   Smoking status: Never   Smokeless tobacco: Never  Vaping Use   Vaping Use: Never used  Substance and Sexual Activity   Alcohol use: No   Drug use: Never   Sexual activity: Yes    Birth control/protection: Inserts  Other Topics Concern   Not on file  Social  History Narrative   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 She does well in school.   Lives with her grandparents   Social Determinants of Health   Financial Resource Strain: Medium Risk (11/20/2022)   Overall Financial Resource Strain (CARDIA)    Difficulty of Paying Living Expenses: Somewhat hard  Food Insecurity: Food Insecurity Present (11/20/2022)   Hunger Vital Sign    Worried About Running Out of Food in the Last Year: Sometimes true    Ran Out of Food in the Last Year: Never true  Transportation Needs: No Transportation Needs (11/20/2022)   PRAPARE - Hydrologist (Medical): No    Lack of Transportation (Non-Medical): No  Physical Activity: Unknown (11/20/2022)   Exercise Vital Sign    Days of Exercise per Week: 0 days    Minutes of Exercise per Session: Not on file  Stress: Stress Concern Present (11/20/2022)   [place]    Feeling of Stress : Very much  Social Connections: Moderately Integrated (11/20/2022)   Social Connection and Isolation Panel [NHANES]    Frequency of Communication with Friends and Family: More than three times a week    Frequency of Social Gatherings with Friends and Family: Twice a  week    Attends Religious Services: 1 to 4 times per year    Active Member of Clubs or Organizations: No    Attends Archivist Meetings: Not on file    Marital Status: Living with partner    There were no vitals filed for this visit. There is no height or weight on file to calculate BMI.  Physical Exam  ASSESSMENT AND PLAN: There are no diagnoses linked to this encounter.  No follow-ups on file.  Betty G. Martinique, MD  [facility] [place]. [place] office.  Discharge Instructions   None

## 2022-12-30 ENCOUNTER — Encounter: Payer: Self-pay | Admitting: Family Medicine

## 2022-12-30 ENCOUNTER — Telehealth (INDEPENDENT_AMBULATORY_CARE_PROVIDER_SITE_OTHER): Payer: Medicaid Other | Admitting: Family Medicine

## 2022-12-30 ENCOUNTER — Ambulatory Visit: Payer: Medicaid Other | Admitting: Family Medicine

## 2022-12-30 VITALS — Ht 65.0 in

## 2022-12-30 DIAGNOSIS — F332 Major depressive disorder, recurrent severe without psychotic features: Secondary | ICD-10-CM | POA: Diagnosis not present

## 2022-12-30 DIAGNOSIS — F411 Generalized anxiety disorder: Secondary | ICD-10-CM | POA: Diagnosis not present

## 2022-12-30 DIAGNOSIS — R41840 Attention and concentration deficit: Secondary | ICD-10-CM

## 2022-12-30 NOTE — Assessment & Plan Note (Signed)
Problem is stable. Continue Sertraline 50 mg daily.

## 2022-12-30 NOTE — Assessment & Plan Note (Signed)
Problem has improved. She would like to continue same dose of Sertraline. Recommend CBT.

## 2022-12-30 NOTE — Progress Notes (Signed)
Virtual Visit via Video Note I connected with Lindsay Rosario on 12/30/22 by a video enabled telemedicine application and verified that I am speaking with the correct person using two identifiers. Location patient: In her car. Location provider:work office Persons participating in the virtual visit: patient, provider  I discussed the limitations of evaluation and management by telemedicine and the availability of in person appointments. The patient expressed understanding and agreed to proceed.  Chief Complaint  Patient presents with   attention span   HPI: Lindsay Rosario is a 22 year old female with history of seizure disorder,migraine headaches,GAD ,and depression complaining about problems with concentration. She recently started school, [employer], and changed jobs. She has difficulty focusing on certain activities but not all the time, it is more problematic when when studying at home. Procrastinating frequently, spending hours on her phone or other activities instead engaging in school related activities.  She has not had problems with concentration before.  Anxiety and depression on Sertraline 50 mg daily. Her anxiety is generally well-controlled with her current treatment. She reports occasional nervousness when being observed by pharmacists and pharmacy techs at work but does not wish to change the dose of her medication.  She states that the medication has helped significantly with "racing thoughts" and episodes of chest pressure.  ROS: See pertinent positives and negatives per HPI.  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   Seizures ([place])     Past Surgical History:  Procedure Laterality Date   MOUTH SURGERY     wisdom teeth removed x 2    Family History  Problem Relation  Age of Onset   Migraines Mother    Migraines Brother    Irritable bowel syndrome Maternal Aunt    Colon cancer Neg Hx    Esophageal cancer Neg Hx     Social History   Socioeconomic History   Marital status: Significant Other    Spouse name: Not on file   Number of children: 0   Years of education: Not on file   Highest education level: 12th grade  Occupational History   Occupation: [employer]    Comment: Public house manager  Tobacco Use   Smoking status: Never   Smokeless tobacco: Never  Vaping Use   Vaping Use: Never used  Substance and Sexual Activity   Alcohol use: No   Drug use: Never   Sexual activity: Yes    Birth control/protection: Inserts  Other Topics Concern   Not on file  Social History Narrative   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 She does well in school.   Lives with her grandparents   Social Determinants of Health   Financial Resource Strain: Medium Risk (11/20/2022)   Overall Financial Resource Strain (CARDIA)    Difficulty of Paying Living Expenses: Somewhat hard  Food Insecurity: Food Insecurity Present (11/20/2022)   Hunger Vital Sign    Worried About Running Out of Food in the Last Year: Sometimes true    Ran Out of Food in the Last Year: Never true  Transportation Needs: No Transportation Needs (11/20/2022)   PRAPARE - Hydrologist (Medical): No    Lack of Transportation (Non-Medical): No  Physical Activity: Unknown (11/20/2022)   Exercise Vital  Sign    Days of Exercise per Week: 0 days    Minutes of Exercise per Session: Not on file  Stress: Stress Concern Present (11/20/2022)   [place]    Feeling of Stress : Very much  Social Connections: Moderately Integrated (11/20/2022)   Social Connection and Isolation Panel [NHANES]    Frequency of Communication with Friends and Family: More than  three times a week    Frequency of Social Gatherings with Friends and Family: Twice a week    Attends Religious Services: 1 to 4 times per year    Active Member of [employer] or Organizations: No    Attends Music therapist: Not on file    Marital Status: Living with partner  Intimate Partner Violence: Not on file   Current Outpatient Medications:    dicyclomine (BENTYL) 10 MG capsule, Take 1 capsule (10 mg total) by mouth 3 (three) times daily as needed for spasms., Disp: 90 capsule, Rfl: 1   etonogestrel-ethinyl estradiol (NUVARING) 0.12-0.015 MG/24HR vaginal ring, PLACE 1 RING VAGINALLY EVERY 28 (TWENTY-EIGHT) DAYS., Disp: 3 each, Rfl: 2   fluticasone (FLONASE) 50 MCG/ACT nasal spray, Place 2 sprays into both nostrils daily., Disp: 16 g, Rfl: 6   sertraline (ZOLOFT) 50 MG tablet, Take 1 tablet (50 mg total) by mouth daily., Disp: 30 tablet, Rfl: 0   triamcinolone cream (KENALOG) 0.1 %, Apply 1 application  topically 2 (two) times daily as needed., Disp: 30 g, Rfl: 1   zonisamide (ZONEGRAN) 100 MG capsule, Take 3 capsules (300 mg total) by mouth daily., Disp: 90 capsule, Rfl: 2  EXAM:  VITALS per patient if applicable:Ht '5\' 5"'$  (1.651 m)   BMI 18.39 kg/m   GENERAL: alert, oriented, appears well and in no acute distress  HEENT: atraumatic, conjunctiva clear, no obvious abnormalities on inspection.  LUNGS: on inspection no signs of respiratory distress, breathing rate appears normal, no obvious gross SOB, gasping or wheezing  CV: no obvious cyanosis  MS: moves all visible extremities without noticeable abnormality  PSYCH/NEURO: pleasant and cooperative, no obvious depression or anxiety, speech and thought processing grossly intact  ASSESSMENT AND PLAN:  Discussed the following assessment and plan:  Difficulty concentrating We discussed possible etiologies, some of her chronic medical conditions can be contributing factors. No prior Hx of ADHD. She would like to be  screened for ADHD/ADD, gave her contact information of some providers in the area, so she can arrange appt.  GAD (generalized anxiety disorder) Assessment & Plan: Problem has improved. She would like to continue same dose of Sertraline. Recommend CBT.  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 Continue Sertraline 50 mg daily.  We discussed possible serious and likely etiologies, options for evaluation and workup, limitations of telemedicine visit vs in person visit, treatment, treatment risks and precautions. The patient was advised to call back or seek an in-person evaluation if the symptoms worsen or if the condition fails to improve as anticipated. I discussed the assessment and treatment plan with the patient. The patient was provided an opportunity to ask questions and all were answered. The patient agreed with the plan and demonstrated an understanding of the instructions.  Return if symptoms worsen or fail to improve, for keep next appointment.  Breionna Punt Martinique, MD

## 2023-01-03 ENCOUNTER — Other Ambulatory Visit: Payer: Self-pay | Admitting: Family Medicine

## 2023-01-03 DIAGNOSIS — F332 Major depressive disorder, recurrent severe without psychotic features: Secondary | ICD-10-CM

## 2023-01-04 ENCOUNTER — Other Ambulatory Visit (HOSPITAL_COMMUNITY): Payer: Self-pay

## 2023-01-04 MED ORDER — SERTRALINE HCL 50 MG PO TABS
50.0000 mg | ORAL_TABLET | Freq: Every day | ORAL | 1 refills | Status: DC
  Filled 2023-01-04 – 2023-03-18 (×3): qty 90, 90d supply, fill #0

## 2023-01-22 ENCOUNTER — Other Ambulatory Visit (HOSPITAL_COMMUNITY): Payer: Self-pay

## 2023-02-01 ENCOUNTER — Telehealth: Payer: 59 | Admitting: Physician Assistant

## 2023-02-01 DIAGNOSIS — B9789 Other viral agents as the cause of diseases classified elsewhere: Secondary | ICD-10-CM | POA: Diagnosis not present

## 2023-02-01 DIAGNOSIS — J038 Acute tonsillitis due to other specified organisms: Secondary | ICD-10-CM

## 2023-02-01 NOTE — Progress Notes (Signed)
E-Visit for Sore Throat  We are sorry that you are not feeling well.  Here is how we plan to help!  Your symptoms indicate a likely viral infection (Pharyngitis).   Pharyngitis is inflammation in the back of the throat which can cause a sore throat, scratchiness and sometimes difficulty swallowing.   Pharyngitis is typically caused by a respiratory virus and will just run its course.  Please keep in mind that your symptoms could last up to 10 days.  For throat pain, we recommend over the counter oral pain relief medications such as acetaminophen or aspirin, or anti-inflammatory medications such as ibuprofen or naproxen sodium.  Topical treatments such as oral throat lozenges or sprays may be used as needed.  Avoid close contact with loved ones, especially the very young and elderly.  Remember to wash your hands thoroughly throughout the day as this is the number one way to prevent the spread of infection and wipe down door knobs and counters with disinfectant.  After careful review of your answers, I would not recommend an antibiotic for your condition.  Antibiotics should not be used to treat conditions that we suspect are caused by viruses like the virus that causes the common cold or flu. However, some people can have Strep with atypical symptoms. You may need formal testing in clinic or office to confirm if your symptoms continue or worsen.  Providers prescribe antibiotics to treat infections caused by bacteria. Antibiotics are very powerful in treating bacterial infections when they are used properly.  To maintain their effectiveness, they should be used only when necessary.  Overuse of antibiotics has resulted in the development of super bugs that are resistant to treatment!    Home Care: Only take medications as instructed by your medical team. Do not drink alcohol while taking these medications. A steam or ultrasonic humidifier can help congestion.  You can place a towel over your head and  breathe in the steam from hot water coming from a faucet. Avoid close contacts especially the very young and the elderly. Cover your mouth when you cough or sneeze. Always remember to wash your hands.  Get Help Right Away If: You develop worsening fever or throat pain. You develop a severe head ache or visual changes. Your symptoms persist after you have completed your treatment plan.  Make sure you Understand these instructions. Will watch your condition. Will get help right away if you are not doing well or get worse.   Thank you for choosing an e-visit.  Your e-visit answers were reviewed by a board certified advanced clinical practitioner to complete your personal care plan. Depending upon the condition, your plan could have included both over the counter or prescription medications.  Please review your pharmacy choice. Make sure the pharmacy is open so you can pick up prescription now. If there is a problem, you may contact your provider through MyChart messaging and have the prescription routed to another pharmacy.  Your safety is important to us. If you have drug allergies check your prescription carefully.   For the next 24 hours you can use MyChart to ask questions about today's visit, request a non-urgent call back, or ask for a work or school excuse. You will get an email in the next two days asking about your experience. I hope that your e-visit has been valuable and will speed your recovery.  I have spent 5 minutes in review of e-visit questionnaire, review and updating patient chart, medical decision making and response   to patient.   Anayiah Howden M [place] Giammona, PA-C  

## 2023-02-23 DIAGNOSIS — F411 Generalized anxiety disorder: Secondary | ICD-10-CM | POA: Diagnosis not present

## 2023-02-28 MED FILL — Etonogestrel-Ethinyl Estradiol VA Ring 0.12-0.015 MG/24HR: VAGINAL | 84 days supply | Qty: 3 | Fill #1 | Status: CN

## 2023-03-01 ENCOUNTER — Other Ambulatory Visit: Payer: Self-pay

## 2023-03-03 DIAGNOSIS — F411 Generalized anxiety disorder: Secondary | ICD-10-CM | POA: Diagnosis not present

## 2023-03-10 DIAGNOSIS — F411 Generalized anxiety disorder: Secondary | ICD-10-CM | POA: Diagnosis not present

## 2023-03-12 ENCOUNTER — Other Ambulatory Visit (HOSPITAL_COMMUNITY): Payer: Self-pay

## 2023-03-17 DIAGNOSIS — F411 Generalized anxiety disorder: Secondary | ICD-10-CM | POA: Diagnosis not present

## 2023-03-18 ENCOUNTER — Other Ambulatory Visit (HOSPITAL_COMMUNITY): Payer: Self-pay

## 2023-03-18 MED FILL — Etonogestrel-Ethinyl Estradiol VA Ring 0.12-0.015 MG/24HR: VAGINAL | 84 days supply | Qty: 3 | Fill #1 | Status: AC

## 2023-03-24 DIAGNOSIS — F411 Generalized anxiety disorder: Secondary | ICD-10-CM | POA: Diagnosis not present

## 2023-04-01 DIAGNOSIS — F411 Generalized anxiety disorder: Secondary | ICD-10-CM | POA: Diagnosis not present

## 2023-04-09 ENCOUNTER — Telehealth: Payer: Self-pay | Admitting: Neurology

## 2023-04-09 NOTE — Telephone Encounter (Signed)
Per patient she takes her Zonisamide at night but she works third shift now what should she do.   Spoke with Dr.Jaffe whatever time she goes to be after her shift is fine.  Per Patient it would be around 9 am.  Patient is to take her medication tonight at 8 pm and again at 9 am tomorrow to start the new routine.

## 2023-04-09 NOTE — Telephone Encounter (Signed)
Pt needs to speak with someone about medication zonisamide and the loss of time

## 2023-04-10 ENCOUNTER — Encounter: Payer: Self-pay | Admitting: Internal Medicine
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
## 2023-04-13 ENCOUNTER — Ambulatory Visit: Payer: Medicaid Other | Admitting: Neurology

## 2023-04-14 DIAGNOSIS — F411 Generalized anxiety disorder: Secondary | ICD-10-CM | POA: Diagnosis not present

## 2023-04-19 ENCOUNTER — Encounter: Payer: Self-pay | Admitting: Family Medicine

## 2023-04-19 ENCOUNTER — Ambulatory Visit (INDEPENDENT_AMBULATORY_CARE_PROVIDER_SITE_OTHER): Payer: 59 | Admitting: Family Medicine

## 2023-04-19 VITALS — BP 110/62 | HR 94 | Temp 99.3°F | Resp 12 | Ht 65.0 in | Wt 110.0 lb

## 2023-04-19 DIAGNOSIS — F332 Major depressive disorder, recurrent severe without psychotic features: Secondary | ICD-10-CM

## 2023-04-19 DIAGNOSIS — N61 Mastitis without abscess: Secondary | ICD-10-CM

## 2023-04-19 DIAGNOSIS — R519 Headache, unspecified: Secondary | ICD-10-CM

## 2023-04-19 MED ORDER — SULFAMETHOXAZOLE-TRIMETHOPRIM 800-160 MG PO TABS: 1.0000 | ORAL_TABLET | Freq: Two times a day (BID) | ORAL | 0 refills | Status: DC

## 2023-04-19 NOTE — Assessment & Plan Note (Signed)
Stable. Currently on Sertraline 50 mg daily. She is following with Dr Greggory Stallion, pending ADHD evaluation to be completed.

## 2023-04-19 NOTE — Progress Notes (Signed)
ACUTE VISIT Chief Complaint  Patient presents with   Breast Problem   HPI: Lindsay Rosario is a 22 y.o. female with PMHx significant for depression, anxiety, migraine headaches, and seizure disorder here today complaining of left breast tenderness and erythema noted yesterday and spreading around left nipple.  Rash This is a new problem. The current episode started yesterday. The problem has been gradually worsening since onset. The rash is characterized by itchiness, pain and redness. She was exposed to nothing. Associated symptoms include fatigue. Pertinent negatives include no anorexia, congestion, cough, diarrhea, eye pain, facial edema, fever, joint pain, nail changes, rhinorrhea, shortness of breath, sore throat or vomiting. Past treatments include nothing.  Pain radiates up into the left armpit. Additionally, she experiences pruritus around the nipple but denies any discharge or masses. No hx of of trauma, insect bite, or new deodorant/lotion,soap. She has not tried OTC treatments.  She also reports a frontal headache that began yesterday around 6 o'clock and has not subsided. She describes the headache as a dull pressure with an intensity rating between four and five/10. She denies any visual changes, nausea, or vomiting associated with the headache. She follows with neurology regularly. Brain MRI on 12/21/21:Normal.  Her sleep schedule has recently changed due to working night shifts.  Her last menstrual period was over three months ago, and she has been using the Nuvaring for 3 months at the time, she removed two days ago.  Depression on sertraline 50 mg daily. Since her last visit, 12/30/2022, she has established with a psychologist and currently she is completing ADHD evaluation. In general she feels that medication is helping.  Review of Systems  Constitutional:  Positive for fatigue. Negative for fever.  HENT:  Negative for congestion, rhinorrhea and sore throat.   Eyes:   Negative for pain.  Respiratory:  Negative for cough and shortness of breath.   Gastrointestinal:  Negative for anorexia, diarrhea and vomiting.  Genitourinary:  Negative for decreased urine volume, dysuria and hematuria.  Musculoskeletal:  Negative for joint pain.  Skin:  Positive for rash. Negative for nail changes.  Neurological:  Negative for seizures, syncope and facial asymmetry.  Psychiatric/Behavioral:  Negative for confusion and hallucinations.   See other pertinent positives and negatives in HPI.  Current Outpatient Medications on File Prior to Visit  Medication Sig Dispense Refill   dicyclomine (BENTYL) 10 MG capsule Take 1 capsule (10 mg total) by mouth 3 (three) times daily as needed for spasms. 90 capsule 1   etonogestrel-ethinyl estradiol (NUVARING) 0.12-0.015 MG/24HR vaginal ring PLACE 1 RING VAGINALLY EVERY 28 (TWENTY-EIGHT) DAYS. 3 each 2   fluticasone (FLONASE) 50 MCG/ACT nasal spray Place 2 sprays into both nostrils daily. 16 g 6   sertraline (ZOLOFT) 50 MG tablet Take 1 tablet (50 mg total) by mouth daily. 90 tablet 1   triamcinolone cream (KENALOG) 0.1 % Apply 1 application  topically 2 (two) times daily as needed. 30 g 1   zonisamide (ZONEGRAN) 100 MG capsule Take 3 capsules (300 mg total) by mouth daily. 90 capsule 2   No current facility-administered medications on file prior to visit.   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 She does well in school.   Lives with her grandparents   Social Determinants of Health   Financial Resource Strain: Medium Risk (11/20/2022)   Overall Financial Resource Strain (CARDIA)    Difficulty of Paying Living Expenses: Somewhat hard  Food Insecurity: Food Insecurity Present (11/20/2022)   Hunger Vital Sign    Worried About Running Out of Food in the Last Year: Sometimes true    Ran Out of Food in the Last Year: Never true  Transportation Needs: No Transportation Needs (11/20/2022)   PRAPARE - Administrator, Civil Service (Medical): No    Lack of Transportation (Non-Medical): No  Physical Activity: Unknown (11/20/2022)   Exercise Vital Sign    Days of Exercise per Week: 0 days    Minutes of Exercise per Session: Not on file  Stress: Stress Concern Present (11/20/2022)   [employer] of Occupational Health - Occupational Stress Questionnaire    Feeling of Stress : Very much  Social Connections: Moderately Integrated (11/20/2022)   Social Connection and Isolation Panel [NHANES]    Frequency of Communication with Friends and Family: More than three times a week    Frequency of Social Gatherings with Friends and Family: Twice a week    Attends Religious Services: 1 to 4 times per year    Active Member of [employer] or Organizations: No    Attends Banker Meetings: Not on file    Marital Status: Living with partner   Vitals:   04/19/23 0804  BP: 110/62  Pulse: 94  Resp: 12  Temp: 99.3 F (37.4 C)  SpO2: 98%   Body mass index is  18.3 kg/m.  Physical Exam Vitals and nursing note reviewed.  Constitutional:      General: She is not in acute distress.    Appearance: She is well-developed.  HENT:     Head: Normocephalic and atraumatic.     Mouth/Throat:     Mouth: Mucous membranes are moist.     Pharynx: Oropharynx is clear.  Eyes:     Conjunctiva/sclera: Conjunctivae normal.  Pulmonary:     Effort: Pulmonary effort is normal. No respiratory distress.     Breath sounds: Normal breath sounds.  Chest:  Breasts:    Right: No swelling, mass, nipple discharge, skin change or tenderness.     Left: Skin change and tenderness present. No swelling, bleeding, inverted nipple, mass or nipple discharge.       Comments: Fibrocystic like changes, bilateral. Lymphadenopathy:     Cervical: No cervical adenopathy.     Right cervical: No superficial cervical adenopathy.    Left cervical: No superficial cervical adenopathy.     Upper Body:     Right  upper body: No axillary adenopathy.     Left upper body: No axillary (Tenderness with palpation.) adenopathy.  Skin:    General: Skin is warm.     Findings: Erythema and rash present. Rash is macular (around left nipple).  Neurological:     General: No focal deficit present.     Mental Status: She is alert and oriented to person, place, and time.     Cranial Nerves: No cranial nerve deficit.     Gait: Gait normal.     Deep Tendon Reflexes:     Reflex Scores:      Patellar reflexes are 2+ on the right side and 2+ on the left side. Psychiatric:        Mood and Affect: Mood and affect normal.   ASSESSMENT AND PLAN:  Lindsay Rosario was seen today for left breast erythema and headache.  Cellulitis of left breast We discussed differential diagnosis. At this time I do not think imaging is necessary but he will be considered if problem does not resolve or becomes recurrent. Recommend warm compresses. Monitor for new symptoms. Bactrim DS twice daily for 7 days. Instructed about  warning signs.  -     Sulfamethoxazole-Trimethoprim; Take 1 tablet by mouth 2 (two) times daily.  Dispense: 14 tablet; Refill: 0  Headache, unspecified headache type Different to her typical migraine. Could be related to above problem. Monitor for new symptoms. Adequate hydration. Instructed about warning signs. If problem doe snot resolve, recommend contacting her neurologist.  Moderately severe recurrent major depression (HCC) Assessment & Plan: Stable. Currently on Sertraline 50 mg daily. She is following with Dr Greggory Stallion, pending ADHD evaluation to be completed.  Return if symptoms worsen or fail to improve.  Lindsay Wyman G. [place], MD  [facility]. [place] office.

## 2023-04-19 NOTE — Patient Instructions (Addendum)
A few things to remember from today's visit:  Headache, unspecified headache type  Cellulitis of left breast - Plan: sulfamethoxazole-trimethoprim (BACTRIM DS) 800-160 MG tablet Warm compresses. Monitor for new symptoms. If headache is not any better, you need to call your neurologist.  If you need refills for medications you take chronically, please call your pharmacy. Do not use My Chart to request refills or for acute issues that need immediate attention. If you send a my chart message, it may take a few days to be addressed, specially if I am not in the office.  Please be sure medication list is accurate. If a new problem present, please set up appointment sooner than planned today.

## 2023-04-22 DIAGNOSIS — F411 Generalized anxiety disorder: Secondary | ICD-10-CM | POA: Diagnosis not present

## 2023-04-30 DIAGNOSIS — F411 Generalized anxiety disorder: Secondary | ICD-10-CM | POA: Diagnosis not present

## 2023-05-12 DIAGNOSIS — F411 Generalized anxiety disorder: Secondary | ICD-10-CM | POA: Diagnosis not present

## 2023-05-21 DIAGNOSIS — F411 Generalized anxiety disorder: Secondary | ICD-10-CM | POA: Diagnosis not present

## 2023-05-26 NOTE — Progress Notes (Deleted)
HPI: [name] is a 22 y.o. female, who is here today for her routine physical.  Last CPE: 05/26/22  Regular exercise 3 or more time per week: *** Following a healthy diet: ***  Chronic medical problems: Depression, anxiety, chronic diarrhea,insomnia, migraine headaches, and history of seizures.   Immunization History  Administered Date(s) Administered  . Hpv-Unspecified 04/08/2018, 06/30/2018, 09/23/2018  . Influenza-Unspecified 08/25/2021  . MMR 10/19/2001, 09/11/2004  . PFIZER(Purple Top)SARS-COV-2 Vaccination 03/17/2020, 04/10/2020  . PPD Test 05/15/2020  . Tdap 06/22/2011, 05/26/2022   Health Maintenance  Topic Date Due  . COVID-19 Vaccine (3 - 2023-24 season) 07/03/2022  . INFLUENZA VACCINE  06/03/2023  . PAP-Cervical Cytology Screening  11/03/2023  . PAP SMEAR-Modifier  11/03/2023  . DTaP/Tdap/Td (3 - Td or Tdap) 05/26/2032  . HPV VACCINES  Completed  . Hepatitis C Screening  Completed  . HIV Screening  Completed    She has *** concerns today.  Review of Systems  Current Outpatient Medications on File Prior to Visit  Medication Sig Dispense Refill  . dicyclomine (BENTYL) 10 MG capsule Take 1 capsule (10 mg total) by mouth 3 (three) times daily as needed for spasms. 90 capsule 1  . etonogestrel-ethinyl estradiol (NUVARING) 0.12-0.015 MG/24HR vaginal ring PLACE 1 RING VAGINALLY EVERY 28 (TWENTY-EIGHT) DAYS. 3 each 2  . fluticasone (FLONASE) 50 MCG/ACT nasal spray Place 2 sprays into both nostrils daily. 16 g 6  . sertraline (ZOLOFT) 50 MG tablet Take 1 tablet (50 mg total) by mouth daily. 90 tablet 1  . sulfamethoxazole-trimethoprim (BACTRIM DS) 800-160 MG tablet Take 1 tablet by mouth 2 (two) times daily. 14 tablet 0  . triamcinolone cream (KENALOG) 0.1 % Apply 1 application  topically 2 (two) times daily as needed. 30 g 1  . zonisamide (ZONEGRAN) 100 MG capsule Take 3 capsules (300 mg total) by mouth daily. 90 capsule 2   No current  facility-administered medications on file prior to visit.    Past Medical History:  Diagnosis Date  . GAD (generalized anxiety disorder) 04/14/2022  . Headache(784.0)   . Lactose intolerance    she said she can do some things  . 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 Seizures (HCC)     Past Surgical History:  Procedure Laterality Date  . MOUTH SURGERY     wisdom teeth removed x 2    Allergies  Allergen Reactions  . Kiwi Extract     Lip goes numb     Family History  Problem Relation Age of Onset  . Migraines Mother   . Migraines Brother   . Irritable bowel syndrome Maternal Aunt   . Colon cancer Neg Hx   . Esophageal cancer Neg Hx     Social History   Socioeconomic History  . Marital status: Significant Other    Spouse name: Not on file  . Number of children: 0  . Years of education: Not on file  . Highest education level: 12th grade  Occupational History  . 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 Smoking status: Never  . Smokeless tobacco: Never  Vaping Use  . Vaping status: Never Used  Substance and Sexual Activity  . Alcohol use: No  . Drug use: Never  . Sexual activity: Yes    Birth control/protection: Inserts  Other Topics Concern  . Not on file  Social History Narrative   Lindsay Rosario is a recent graduate from [employer]; she  attends [employer] and majors in Radiology. She does well in school.   Lives with her grandparents   Social Determinants of Health   Financial Resource Strain: Medium Risk (11/20/2022)   Overall Financial Resource Strain (CARDIA)   . Difficulty of Paying Living Expenses: Somewhat hard  Food Insecurity: Food Insecurity Present (11/20/2022)   Hunger Vital Sign   . Worried About Programme researcher, broadcasting/film/video in the Last Year: Sometimes true   . Ran Out of Food in the Last Year: Never true  Transportation Needs: No Transportation Needs (11/20/2022)   PRAPARE - Transportation   .  Lack of Transportation (Medical): No   . Lack of Transportation (Non-Medical): No  Physical Activity: Unknown (11/20/2022)   Exercise Vital Sign   . Days of Exercise per Week: 0 days   . Minutes of Exercise per Session: Not on file  Stress: Stress Concern Present (11/20/2022)   [employer] of Occupational Health - Occupational Stress Questionnaire   . Feeling of Stress : Very much  Social Connections: Moderately Integrated (11/20/2022)   Social Connection and Isolation Panel [NHANES]   . Frequency of Communication with Friends and Family: More than three times a week   . Frequency of Social Gatherings with Friends and Family: Twice a week   . Attends Religious Services: 1 to 4 times per year   . Active Member of Clubs or Organizations: No   . Attends Banker Meetings: Not on file   . Marital Status: Living with partner    There were no vitals filed for this visit. There is no height or weight on file to calculate BMI.  Wt Readings from Last 3 Encounters:  04/19/23 110 lb (49.9 kg)  12/28/22 110 lb 8 oz (50.1 kg)  10/21/22 111 lb (50.3 kg)    Physical Exam Vitals and nursing note reviewed.  Constitutional:      General: She is not in acute distress.    Appearance: She is well-developed.  HENT:     Head: Normocephalic and atraumatic.     Right Ear: Hearing, tympanic membrane, ear canal and external ear normal.     Left Ear: Hearing, tympanic membrane, ear canal and external ear normal.     Mouth/Throat:     Mouth: Mucous membranes are moist.     Pharynx: Oropharynx is clear. Uvula midline.  Eyes:     Extraocular Movements: Extraocular movements intact.     Conjunctiva/sclera: Conjunctivae normal.     Pupils: Pupils are equal, round, and reactive to light.  Neck:     Thyroid: No thyromegaly.     Trachea: No tracheal deviation.  Cardiovascular:     Rate and Rhythm: Normal rate and regular rhythm.     Pulses:          Dorsalis pedis pulses are 2+ on the  right side and 2+ on the left side.       Posterior tibial pulses are 2+ on the right side and 2+ on the left side.     Heart sounds: No murmur heard. Pulmonary:     Effort: Pulmonary effort is normal. No respiratory distress.     Breath sounds: Normal breath sounds.  Abdominal:     Palpations: Abdomen is soft. There is no hepatomegaly or mass.     Tenderness: There is no abdominal tenderness.  Genitourinary:    Comments: Deferred to gyn. Musculoskeletal:     Comments: No major deformity or signs of synovitis appreciated.  Lymphadenopathy:  Cervical: No cervical adenopathy.     Upper Body:     Right upper body: No supraclavicular adenopathy.     Left upper body: No supraclavicular adenopathy.  Skin:    General: Skin is warm.     Findings: No erythema or rash.  Neurological:     General: No focal deficit present.     Mental Status: She is alert and oriented to person, place, and time.     Cranial Nerves: No cranial nerve deficit.     Coordination: Coordination normal.     Gait: Gait normal.     Deep Tendon Reflexes:     Reflex Scores:      Bicep reflexes are 2+ on the right side and 2+ on the left side.      Patellar reflexes are 2+ on the right side and 2+ on the left side. Psychiatric:     Comments: Well groomed, good eye contact.   ASSESSMENT AND PLAN: Ms. Lindsay Rosario was here today annual physical examination.  No orders of the defined types were placed in this encounter.   There are no diagnoses linked to this encounter.  There are no diagnoses linked to this encounter.  No follow-ups on file.  Betty G. [place], MD  [facility]. [place] office.

## 2023-05-28 ENCOUNTER — Encounter: Payer: 59 | Admitting: Family Medicine

## 2023-05-28 DIAGNOSIS — Z0000 Encounter for general adult medical examination without abnormal findings: Secondary | ICD-10-CM

## 2023-05-28 DIAGNOSIS — F411 Generalized anxiety disorder: Secondary | ICD-10-CM | POA: Diagnosis not present

## 2023-06-02 ENCOUNTER — Encounter (INDEPENDENT_AMBULATORY_CARE_PROVIDER_SITE_OTHER): Payer: Self-pay

## 2023-06-10 DIAGNOSIS — F411 Generalized anxiety disorder: Secondary | ICD-10-CM | POA: Diagnosis not present

## 2023-06-23 NOTE — Progress Notes (Unsigned)
HPI: Lindsay Rosario is a 22 y.o. female, who is here today for her routine physical.  Last CPE: 05/26/22  Regular exercise 3 or more time per week: *** Following a healthy diet: ***  Chronic medical problems: ***  Immunization History  Administered Date(s) Administered   Hpv-Unspecified 04/08/2018, 06/30/2018, 09/23/2018   Influenza-Unspecified 08/25/2021, 08/24/2022   MMR 10/19/2001, 09/11/2004   PFIZER(Purple Top)SARS-COV-2 Vaccination 03/17/2020, 04/10/2020   PPD Test 05/15/2020   Tdap 06/22/2011, 05/26/2022   Health Maintenance  Topic Date Due   INFLUENZA VACCINE  07/01/2023 (Originally 06/03/2023)   COVID-19 Vaccine (3 - 2023-24 season) 07/11/2023 (Originally 07/03/2022)   PAP-Cervical Cytology Screening  11/03/2023   PAP SMEAR-Modifier  11/03/2023   DTaP/Tdap/Td (3 - Td or Tdap) 05/26/2032   HPV VACCINES  Completed   Hepatitis C Screening  Completed   HIV Screening  Completed    She has *** concerns today. Lab Results  Component Value Date   VITAMINB12 111 (L) 06/24/2022   Lab Results  Component Value Date   WBC 7.8 08/13/2022   HGB 12.8 08/13/2022   HCT 37.5 08/13/2022   MCV 85.8 08/13/2022   PLT 223 08/13/2022   Lab Results  Component Value Date   NA 136 08/13/2022   CL 102 08/13/2022   K 3.6 08/13/2022   CO2 25 08/13/2022   BUN 8 08/13/2022   CREATININE 0.71 08/13/2022   GFRNONAA >60 08/13/2022   CALCIUM 9.1 08/13/2022   ALBUMIN 4.2 08/13/2022   GLUCOSE 90 08/13/2022   Review of Systems  Current Outpatient Medications on File Prior to Visit  Medication Sig Dispense Refill   dicyclomine (BENTYL) 10 MG capsule Take 1 capsule (10 mg total) by mouth 3 (three) times daily as needed for spasms. 90 capsule 1   etonogestrel-ethinyl estradiol (NUVARING) 0.12-0.015 MG/24HR vaginal ring PLACE 1 RING VAGINALLY EVERY 28 (TWENTY-EIGHT) DAYS. 3 each 2   fluticasone (FLONASE) 50 MCG/ACT nasal spray Place 2 sprays into both nostrils daily. 16 g 6    triamcinolone cream (KENALOG) 0.1 % Apply 1 application  topically 2 (two) times daily as needed. 30 g 1   zonisamide (ZONEGRAN) 100 MG capsule Take 3 capsules (300 mg total) by mouth daily. 90 capsule 2   No current facility-administered medications on file prior to visit.   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   Routine general medical examination at a health care facility 06/25/2023   Seizures [facility])    Past Surgical History:  Procedure Laterality Date   MOUTH SURGERY     wisdom teeth removed x 2    Allergies  Allergen Reactions   Kiwi Extract     Lip goes numb     Family History  Problem Relation Age of Onset   Migraines Mother    Migraines Brother    Irritable bowel syndrome Maternal Aunt    Colon cancer Neg Hx    Esophageal cancer Neg Hx     Social History   Socioeconomic History   Marital status: Significant Other    Spouse name: Not on file   Number of children: 0   Years of education: Not on file   Highest education level: 12th grade  Occupational History   Occupation: [employer]    Comment: Administrator  Tobacco Use  Smoking status: Never   Smokeless tobacco: Never  Vaping Use   Vaping status: Never Used  Substance and Sexual Activity   Alcohol use: No   Drug use: Never   Sexual activity: Yes    Birth control/protection: Inserts  Other Topics Concern   Not on file  Social History Narrative   Delberta is a recent graduate from [employer]; 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 She does well in school.   Lives with her grandparents   Social Determinants of Health   Financial Resource Strain: Medium Risk (11/20/2022)   Overall Financial Resource Strain (CARDIA)    Difficulty of Paying Living Expenses: Somewhat hard  Food Insecurity: Food  Insecurity Present (11/20/2022)   Hunger Vital Sign    Worried About Running Out of Food in the Last Year: Sometimes true    Ran Out of Food in the Last Year: Never true  Transportation Needs: No Transportation Needs (11/20/2022)   PRAPARE - Administrator, Civil Service (Medical): No    Lack of Transportation (Non-Medical): No  Physical Activity: Unknown (11/20/2022)   Exercise Vital Sign    Days of Exercise per Week: 0 days    Minutes of Exercise per Session: Not on file  Stress: Stress Concern Present (11/20/2022)   [employer] of Occupational Health - Occupational Stress Questionnaire    Feeling of Stress : Very much  Social Connections: Moderately Integrated (11/20/2022)   Social Connection and Isolation Panel [NHANES]    Frequency of Communication with Friends and Family: More than three times a week    Frequency of Social Gatherings with Friends and Family: Twice a week    Attends Religious Services: 1 to 4 times per year    Active Member of [employer] or Organizations: No    Attends Banker Meetings: Not on file    Marital Status: Living with partner   Vitals:   06/25/23 1052  BP: 110/60  Pulse: 97  Resp: 12  Temp: 98.4 F (36.9 C)  SpO2: 98%   Body mass index is 18.13 kg/m.  Wt Readings from Last 3 Encounters:  06/25/23 110 lb 2 oz (50 kg)  04/19/23 110 lb (49.9 kg)  12/28/22 110 lb 8 oz (50.1 kg)   Physical Exam Vitals and nursing note reviewed.  Constitutional:      General: She is not in acute distress.    Appearance: She is well-developed.  HENT:     Head: Normocephalic and atraumatic.     Right Ear: Hearing, tympanic membrane, ear canal and external ear normal.     Left Ear: Hearing, tympanic membrane, ear canal and external ear normal.     Mouth/Throat:     Mouth: Mucous membranes are moist.     Pharynx: Oropharynx is clear. Uvula midline.  Eyes:     Extraocular Movements: Extraocular movements intact.     Conjunctiva/sclera:  Conjunctivae normal.     Pupils: Pupils are equal, round, and reactive to light.  Neck:     Thyroid: No thyromegaly.     Trachea: No tracheal deviation.  Cardiovascular:     Rate and Rhythm: Normal rate and regular rhythm.     Pulses:          Dorsalis pedis pulses are 2+ on the right side and 2+ on the left side.     Heart sounds: No murmur heard. Pulmonary:     Effort: Pulmonary effort is normal. No respiratory distress.  Breath sounds: Normal breath sounds.  Abdominal:     Palpations: Abdomen is soft. There is no hepatomegaly or mass.     Tenderness: There is no abdominal tenderness.  Genitourinary:    Comments: Deferred to gyn. Musculoskeletal:     Comments: No major deformity or signs of synovitis appreciated.  Lymphadenopathy:     Cervical: No cervical adenopathy.     Upper Body:     Right upper body: No supraclavicular adenopathy.     Left upper body: No supraclavicular adenopathy.  Skin:    General: Skin is warm.     Findings: No erythema or rash.  Neurological:     General: No focal deficit present.     Mental Status: She is alert and oriented to person, place, and time.     Cranial Nerves: No cranial nerve deficit.     Coordination: Coordination normal.     Gait: Gait normal.     Deep Tendon Reflexes:     Reflex Scores:      Bicep reflexes are 2+ on the right side and 2+ on the left side.      Patellar reflexes are 2+ on the right side and 2+ on the left side. Psychiatric:        Mood and Affect: Mood and affect normal.   ASSESSMENT AND PLAN: Ms. NDEA MOSKO was here today annual physical examination.  Orders Placed This Encounter  Procedures   Vitamin B12   VITAMIN D 25 Hydroxy (Vit-D Deficiency, Fractures)    Danylah was seen today for annual exam.  Diagnoses and all orders for this visit:  Routine general medical examination at a health care facility  B12 deficiency -     Vitamin B12; Future  Vitamin D insufficiency -     VITAMIN D 25  Hydroxy (Vit-D Deficiency, Fractures); Future  Moderately severe recurrent major depression (HCC) -     sertraline (ZOLOFT) 50 MG tablet; Take 1 tablet (50 mg total) by mouth daily.    Routine general medical examination at a health care facility  B12 deficiency -     Vitamin B12; Future  Vitamin D insufficiency -     VITAMIN D 25 Hydroxy (Vit-D Deficiency, Fractures); Future  Moderately severe recurrent major depression (HCC) -     Sertraline HCl; Take 1 tablet (50 mg total) by mouth daily.  Dispense: 90 tablet; Refill: 2   Return in about 6 months (around 12/26/2023) for chronic problems.  Farhana Fellows G. [place], MD  [facility]. [place] office.

## 2023-06-24 DIAGNOSIS — F411 Generalized anxiety disorder: Secondary | ICD-10-CM | POA: Diagnosis not present

## 2023-06-25 ENCOUNTER — Encounter: Payer: Self-pay | Admitting: Family Medicine

## 2023-06-25 ENCOUNTER — Ambulatory Visit (INDEPENDENT_AMBULATORY_CARE_PROVIDER_SITE_OTHER): Payer: 59 | Admitting: Family Medicine

## 2023-06-25 VITALS — BP 110/60 | HR 97 | Temp 98.4°F | Resp 12 | Ht 65.35 in | Wt 110.1 lb

## 2023-06-25 DIAGNOSIS — Z0000 Encounter for general adult medical examination without abnormal findings: Secondary | ICD-10-CM | POA: Insufficient documentation

## 2023-06-25 DIAGNOSIS — F411 Generalized anxiety disorder: Secondary | ICD-10-CM | POA: Diagnosis not present

## 2023-06-25 DIAGNOSIS — E538 Deficiency of other specified B group vitamins: Secondary | ICD-10-CM

## 2023-06-25 DIAGNOSIS — E559 Vitamin D deficiency, unspecified: Secondary | ICD-10-CM

## 2023-06-25 DIAGNOSIS — F332 Major depressive disorder, recurrent severe without psychotic features: Secondary | ICD-10-CM | POA: Diagnosis not present

## 2023-06-25 HISTORY — DX: Encounter for general adult medical examination without abnormal findings: Z00.00

## 2023-06-25 LAB — VITAMIN B12: Vitamin B-12: 248 pg/mL (ref 211–911)

## 2023-06-25 LAB — VITAMIN D 25 HYDROXY (VIT D DEFICIENCY, FRACTURES): VITD: 27.62 ng/mL — ABNORMAL LOW (ref 30.00–100.00)

## 2023-06-25 MED ORDER — SERTRALINE HCL 50 MG PO TABS: 50.0000 mg | ORAL_TABLET | Freq: Every day | ORAL | 2 refills | Status: DC

## 2023-06-25 NOTE — Patient Instructions (Addendum)
A few things to remember from today's visit:  Routine general medical examination at a health care facility  B12 deficiency - Plan: Vitamin B12  Vitamin D insufficiency - Plan: VITAMIN D 25 Hydroxy (Vit-D Deficiency, Fractures)  Moderately severe recurrent major depression (HCC) - Plan: sertraline (ZOLOFT) 50 MG tablet  If you need refills for medications you take chronically, please call your pharmacy. Do not use My Chart to request refills or for acute issues that need immediate attention. If you send a my chart message, it may take a few days to be addressed, specially if I am not in the office.  Please be sure medication list is accurate. If a new problem present, please set up appointment sooner than planned today.  Health Maintenance, Female Adopting a healthy lifestyle and getting preventive care are important in promoting health and wellness. Ask your health care provider about: The right schedule for you to have regular tests and exams. Things you can do on your own to prevent diseases and keep yourself healthy. What should I know about diet, weight, and exercise? Eat a healthy diet  Eat a diet that includes plenty of vegetables, fruits, low-fat dairy products, and lean protein. Do not eat a lot of foods that are high in solid fats, added sugars, or sodium. Maintain a healthy weight Body mass index (BMI) is used to identify weight problems. It estimates body fat based on height and weight. Your health care provider can help determine your BMI and help you achieve or maintain a healthy weight. Get regular exercise Get regular exercise. This is one of the most important things you can do for your health. Most adults should: Exercise for at least 150 minutes each week. The exercise should increase your heart rate and make you sweat (moderate-intensity exercise). Do strengthening exercises at least twice a week. This is in addition to the moderate-intensity exercise. Spend less  time sitting. Even light physical activity can be beneficial. Watch cholesterol and blood lipids Have your blood tested for lipids and cholesterol at 23 years of age, then have this test every 5 years. Have your cholesterol levels checked more often if: Your lipid or cholesterol levels are high. You are older than 23 years of age. You are at high risk for heart disease. What should I know about cancer screening? Depending on your health history and family history, you may need to have cancer screening at various ages. This may include screening for: Breast cancer. Cervical cancer. Colorectal cancer. Skin cancer. Lung cancer. What should I know about heart disease, diabetes, and high blood pressure? Blood pressure and heart disease High blood pressure causes heart disease and increases the risk of stroke. This is more likely to develop in people who have high blood pressure readings or are overweight. Have your blood pressure checked: Every 3-5 years if you are 8-27 years of age. Every year if you are 31 years old or older. Diabetes Have regular diabetes screenings. This checks your fasting blood sugar level. Have the screening done: Once every three years after age 43 if you are at a normal weight and have a low risk for diabetes. More often and at a younger age if you are overweight or have a high risk for diabetes. What should I know about preventing infection? Hepatitis B If you have a higher risk for hepatitis B, you should be screened for this virus. Talk with your health care provider to find out if you are at risk for hepatitis B  infection. Hepatitis C Testing is recommended for: Everyone born from [birth year] through 1966. Anyone with known risk factors for hepatitis C. Sexually transmitted infections (STIs) Get screened for STIs, including gonorrhea and chlamydia, if: You are sexually active and are younger than 23 years of age. You are older than 23 years of age and your health  care provider tells you that you are at risk for this type of infection. Your sexual activity has changed since you were last screened, and you are at increased risk for chlamydia or gonorrhea. Ask your health care provider if you are at risk. Ask your health care provider about whether you are at high risk for HIV. Your health care provider may recommend a prescription medicine to help prevent HIV infection. If you choose to take medicine to prevent HIV, you should first get tested for HIV. You should then be tested every 3 months for as long as you are taking the medicine. Pregnancy If you are about to stop having your period (premenopausal) and you may become pregnant, seek counseling before you get pregnant. Take 400 to 800 micrograms (mcg) of folic acid every day if you become pregnant. Ask for birth control (contraception) if you want to prevent pregnancy. Osteoporosis and menopause Osteoporosis is a disease in which the bones lose minerals and strength with aging. This can result in bone fractures. If you are 52 years old or older, or if you are at risk for osteoporosis and fractures, ask your health care provider if you should: Be screened for bone loss. Take a calcium or vitamin D supplement to lower your risk of fractures. Be given hormone replacement therapy (HRT) to treat symptoms of menopause. Follow these instructions at home: Alcohol use Do not drink alcohol if: Your health care provider tells you not to drink. You are pregnant, may be pregnant, or are planning to become pregnant. If you drink alcohol: Limit how much you have to: 0-1 drink a day. Know how much alcohol is in your drink. In the [place] one drink equals one 12 oz bottle of beer (355 mL), one 5 oz glass of wine (148 mL), or one 1 oz glass of hard liquor (44 mL). Lifestyle Do not use any products that contain nicotine or tobacco. These products include cigarettes, chewing tobacco, and vaping devices, such as  e-cigarettes. If you need help quitting, ask your health care provider. Do not use street drugs. Do not share needles. Ask your health care provider for help if you need support or information about quitting drugs. General instructions Schedule regular health, dental, and eye exams. Stay current with your vaccines. Tell your health care provider if: You often feel depressed. You have ever been abused or do not feel safe at home. Summary Adopting a healthy lifestyle and getting preventive care are important in promoting health and wellness. Follow your health care provider's instructions about healthy diet, exercising, and getting tested or screened for diseases. Follow your health care provider's instructions on monitoring your cholesterol and blood pressure. This information is not intended to replace advice given to you by your health care provider. Make sure you discuss any questions you have with your health care provider. Document Revised: 03/10/2021 Document Reviewed: 03/10/2021 Elsevier Patient Education  2024 [employer].

## 2023-06-26 NOTE — Assessment & Plan Note (Signed)
She is not on Vit D supplementation. Further recommendations according to 25 [place] vit D result. 

## 2023-06-26 NOTE — Assessment & Plan Note (Addendum)
Stable. Currently on Sertraline 50 mg daily.

## 2023-06-26 NOTE — Assessment & Plan Note (Signed)
We discussed the importance of regular physical activity and healthy diet for prevention of chronic illness and/or complications. Preventive guidelines reviewed. Vaccination up to date. Next CPE in a year. 

## 2023-06-26 NOTE — Assessment & Plan Note (Signed)
Stressed the importance of compliance with taking medication. Continue Sertraline 50 mg daily. F/U in 6 months, before if needed.

## 2023-06-26 NOTE — Assessment & Plan Note (Signed)
She is not on B12 supplementation. ?Further recommendations according to B12 result. ?

## 2023-06-28 ENCOUNTER — Ambulatory Visit: Payer: Medicaid Other | Admitting: Neurology

## 2023-07-09 DIAGNOSIS — F411 Generalized anxiety disorder: Secondary | ICD-10-CM | POA: Diagnosis not present

## 2023-07-23 DIAGNOSIS — F411 Generalized anxiety disorder: Secondary | ICD-10-CM | POA: Diagnosis not present

## 2023-07-29 ENCOUNTER — Other Ambulatory Visit: Payer: Self-pay | Admitting: Neurology

## 2023-07-29 ENCOUNTER — Other Ambulatory Visit (HOSPITAL_COMMUNITY): Payer: Self-pay

## 2023-07-29 MED ORDER — SERTRALINE HCL 50 MG PO TABS
50.0000 mg | ORAL_TABLET | Freq: Every day | ORAL | 2 refills | Status: DC
  Filled 2023-07-29: qty 90, 90d supply, fill #0
  Filled 2023-11-09: qty 90, 90d supply, fill #1

## 2023-07-30 ENCOUNTER — Other Ambulatory Visit (HOSPITAL_COMMUNITY): Payer: Self-pay

## 2023-07-30 ENCOUNTER — Encounter (HOSPITAL_COMMUNITY): Payer: Self-pay

## 2023-08-02 ENCOUNTER — Other Ambulatory Visit: Payer: Self-pay | Admitting: Neurology

## 2023-08-02 ENCOUNTER — Other Ambulatory Visit (HOSPITAL_COMMUNITY): Payer: Self-pay

## 2023-08-04 ENCOUNTER — Encounter (HOSPITAL_COMMUNITY): Payer: Self-pay

## 2023-08-04 ENCOUNTER — Other Ambulatory Visit (HOSPITAL_COMMUNITY): Payer: Self-pay

## 2023-08-06 ENCOUNTER — Encounter: Payer: Self-pay | Admitting: Neurology

## 2023-08-06 DIAGNOSIS — F411 Generalized anxiety disorder: Secondary | ICD-10-CM | POA: Diagnosis not present

## 2023-08-09 ENCOUNTER — Other Ambulatory Visit (HOSPITAL_COMMUNITY): Payer: Self-pay

## 2023-08-09 ENCOUNTER — Other Ambulatory Visit: Payer: Self-pay | Admitting: Neurology

## 2023-08-09 MED ORDER — ZONISAMIDE 100 MG PO CAPS
300.0000 mg | ORAL_CAPSULE | Freq: Every day | ORAL | 0 refills | Status: DC
  Filled 2023-08-09: qty 90, 30d supply, fill #0

## 2023-08-09 MED FILL — Etonogestrel-Ethinyl Estradiol VA Ring 0.12-0.015 MG/24HR: VAGINAL | 84 days supply | Qty: 3 | Fill #2 | Status: AC
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
## 2023-08-10 NOTE — Telephone Encounter (Signed)
Dr. Everlena Cooper sent

## 2023-08-11 ENCOUNTER — Ambulatory Visit (INDEPENDENT_AMBULATORY_CARE_PROVIDER_SITE_OTHER): Payer: 59 | Admitting: Neurology

## 2023-08-11 ENCOUNTER — Encounter: Payer: Self-pay | Admitting: Neurology

## 2023-08-11 VITALS — BP 113/76 | HR 97 | Ht 63.5 in | Wt 111.0 lb

## 2023-08-11 DIAGNOSIS — G40909 Epilepsy, unspecified, not intractable, without status epilepticus: Secondary | ICD-10-CM

## 2023-08-11 DIAGNOSIS — G44219 Episodic tension-type headache, not intractable: Secondary | ICD-10-CM | POA: Diagnosis not present

## 2023-08-11 NOTE — Patient Instructions (Signed)
Continue zonisamide 300mg  daily

## 2023-08-18 DIAGNOSIS — F411 Generalized anxiety disorder: Secondary | ICD-10-CM | POA: Diagnosis not present

## 2023-09-08 DIAGNOSIS — F411 Generalized anxiety disorder: Secondary | ICD-10-CM | POA: Diagnosis not present

## 2023-09-24 DIAGNOSIS — F411 Generalized anxiety disorder: Secondary | ICD-10-CM | POA: Diagnosis not present

## 2023-09-25 ENCOUNTER — Other Ambulatory Visit: Payer: Self-pay | Admitting: Neurology

## 2023-09-27 ENCOUNTER — Other Ambulatory Visit (HOSPITAL_COMMUNITY): Payer: Self-pay

## 2023-09-27 MED ORDER — ZONISAMIDE 100 MG PO CAPS
300.0000 mg | ORAL_CAPSULE | Freq: Every day | ORAL | 4 refills | Status: DC
  Filled 2023-09-27: qty 90, 30d supply, fill #0
  Filled 2023-11-09: qty 90, 30d supply, fill #1
  Filled 2023-12-14: qty 90, 30d supply, fill #2
  Filled 2024-01-11: qty 90, 30d supply, fill #3

## 2023-10-08 DIAGNOSIS — F411 Generalized anxiety disorder: Secondary | ICD-10-CM | POA: Diagnosis not present

## 2023-10-20 DIAGNOSIS — F411 Generalized anxiety disorder: Secondary | ICD-10-CM | POA: Diagnosis not present

## 2023-10-26 ENCOUNTER — Telehealth: Payer: 59 | Admitting: Family Medicine

## 2023-10-26 ENCOUNTER — Encounter: Payer: Self-pay | Admitting: Family Medicine

## 2023-10-26 VITALS — Ht 63.5 in

## 2023-10-26 DIAGNOSIS — F332 Major depressive disorder, recurrent severe without psychotic features: Secondary | ICD-10-CM

## 2023-10-26 DIAGNOSIS — F411 Generalized anxiety disorder: Secondary | ICD-10-CM | POA: Diagnosis not present

## 2023-10-26 MED ORDER — BUSPIRONE HCL 5 MG PO TABS
5.0000 mg | ORAL_TABLET | Freq: Two times a day (BID) | ORAL | 1 refills | Status: DC
Start: 2023-10-26 — End: 2023-11-19

## 2023-10-26 NOTE — Assessment & Plan Note (Signed)
Problem seems to be getting worse for the past couple months. Currently on sertraline 50 mg daily, recommend continuing same dose. She agrees with adding BuSpar 5 mg twice daily. Referral to psychiatrist placed. Continue CBT every 2 weeks. She was clearly instructed about warning signs, contact information for National Suicide Prevention Lifeline and crisis Text Line given. F/U in 5-6 weeks, before if needed.

## 2023-10-26 NOTE — Progress Notes (Signed)
Virtual Visit via Video Note I connected with Lindsay Rosario on 10/26/2023 by a video enabled telemedicine application and verified that I am speaking with the correct person using two identifiers. Location patient: home Location provider:work office Persons participating in the virtual visit: patient,scribe, provider  I discussed the limitations of evaluation and management by telemedicine and the availability of in person appointments. The patient expressed understanding and agreed to proceed.  Chief Complaint  Patient presents with   Medication Management   HPI:  Lindsay Rosario is a 22 y.o. female with a PMHx significant for epilepsy, B12 deficiency, GAD, insomnia, and vitamin D deficiency, who is being seen on video today for medication follow up.   Patient states she is feeling more depressed since around the time of her birthday in [birth year]. She says she has no motivation to do anything or eat anything, and her hygiene is worsening. She only gets out of bed to go to work, and even then, gets up at the last minute.  She is sleeping ~9 hours when working, but ~12 on days when she doesn't have to work.  She denies SI or a having a plan. Anxiety, which she feels like it is stable.  She sees a therapist every 2 weeks.   She would like to increase dose of Sertraline, currently taking 50 mg daily. Negative for chest pain, dyspnea, palpitation, abdominal pain, N/V, changes in bowel habits, urinary symptoms,focal weakness, or edema.  ROS: See pertinent positives and negatives per HPI.  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   Routine general medical examination at a health care facility 06/25/2023   Seizures [facility])     Past Surgical History:  Procedure Laterality Date   MOUTH SURGERY     wisdom  teeth removed x 2    Family History  Problem Relation Age of Onset   Migraines Mother    Migraines Brother    Irritable bowel syndrome Maternal Aunt    Colon cancer Neg Hx    Esophageal cancer Neg Hx     Social History   Socioeconomic History   Marital status: Significant Other    Spouse name: Not on file   Number of children: 0   Years of education: Not on file   Highest education level: 12th grade  Occupational History   Occupation: [employer]    Comment: Administrator  Tobacco Use   Smoking status: Never   Smokeless tobacco: Never  Vaping Use   Vaping status: Never Used  Substance and Sexual Activity   Alcohol use: No   Drug use: Never   Sexual activity: Yes    Birth control/protection: Inserts  Other Topics Concern   Not on file  Social History Narrative   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 She does well in school.   Lives with her grandparents   Social Drivers of Health   Financial Resource Strain: Medium Risk (11/20/2022)   Overall Financial Resource Strain (CARDIA)    Difficulty of Paying Living Expenses: Somewhat hard  Food Insecurity: Food Insecurity Present (11/20/2022)   Hunger Vital Sign    Worried About Running Out of Food in the Last Year: Sometimes true    Ran Out of Food in the Last Year:  Never true  Transportation Needs: No Transportation Needs (11/20/2022)   PRAPARE - Administrator, Civil Service (Medical): No    Lack of Transportation (Non-Medical): No  Physical Activity: Unknown (11/20/2022)   Exercise Vital Sign    Days of Exercise per Week: 0 days    Minutes of Exercise per Session: Not on file  Stress: Stress Concern Present (11/20/2022)   [employer] of Occupational Health - Occupational Stress Questionnaire    Feeling of Stress : Very much  Social Connections: Moderately Integrated (11/20/2022)   Social Connection and Isolation Panel [NHANES]     Frequency of Communication with Friends and Family: More than three times a week    Frequency of Social Gatherings with Friends and Family: Twice a week    Attends Religious Services: 1 to 4 times per year    Active Member of [employer] or Organizations: No    Attends Engineer, structural: Not on file    Marital Status: Living with partner  Intimate Partner Violence: Unknown (02/06/2022)   Received from [employer], [facility]   HITS    Physically Hurt: Not on file    Insult or Talk Down To: Not on file    Threaten Physical Harm: Not on file    Scream or Curse: Not on file     Current Outpatient Medications:    busPIRone (BUSPAR) 5 MG tablet, Take 1 tablet (5 mg total) by mouth 2 (two) times daily., Disp: 60 tablet, Rfl: 1   dicyclomine (BENTYL) 10 MG capsule, Take 1 capsule (10 mg total) by mouth 3 (three) times daily as needed for spasms., Disp: 90 capsule, Rfl: 1   etonogestrel-ethinyl estradiol (NUVARING) 0.12-0.015 MG/24HR vaginal ring, PLACE 1 RING VAGINALLY EVERY 28 (TWENTY-EIGHT) DAYS., Disp: 3 each, Rfl: 2   fluticasone (FLONASE) 50 MCG/ACT nasal spray, Place 2 sprays into both nostrils daily., Disp: 16 g, Rfl: 6   sertraline (ZOLOFT) 50 MG tablet, Take 1 tablet (50 mg total) by mouth daily., Disp: 90 tablet, Rfl: 2   triamcinolone cream (KENALOG) 0.1 %, Apply 1 application  topically 2 (two) times daily as needed., Disp: 30 g, Rfl: 1   zonisamide (ZONEGRAN) 100 MG capsule, Take 3 capsules (300 mg total) by mouth daily., Disp: 90 capsule, Rfl: 4  EXAM:  VITALS per patient if applicable:Ht 5' 3.5" (1.613 m)   BMI 19.35 kg/m   GENERAL: alert, oriented, appears well and in no acute distress  HEENT: atraumatic, conjunctiva clear, no obvious abnormalities on inspection of external nose and ears  NECK: normal movements of the head and neck  LUNGS: on inspection no signs of respiratory distress, breathing rate appears normal, no obvious gross SOB, gasping or  wheezing  CV: no obvious cyanosis  MS: moves all visible extremities without noticeable abnormality  PSYCH/NEURO: pleasant and cooperative, no obvious depression or anxiety, speech and thought processing grossly intact  ASSESSMENT AND PLAN:  Discussed the following assessment and plan:  Moderately severe recurrent major depression (HCC) Assessment & Plan: Problem seems to be getting worse for the past couple months. Currently on sertraline 50 mg daily, recommend continuing same dose. She agrees with adding BuSpar 5 mg twice daily. Referral to psychiatrist placed. Continue CBT every 2 weeks. She was clearly instructed about warning signs, contact information for National Suicide Prevention Lifeline and crisis Text Line given. F/U in 5-6 weeks, before if needed.  Orders: -     busPIRone HCl; Take 1 tablet (5 mg total) by  mouth 2 (two) times daily.  Dispense: 60 tablet; Refill: 1 -     Ambulatory referral to Psychiatry  GAD (generalized anxiety disorder) Assessment & Plan: Reported as stable. Continue Sertraline 50 mg daily. Buspar 5 mg bid added today to help with depression. CBT q 2 weeks to continue. Psychiatrist referral placed.  Orders: -     Ambulatory referral to Psychiatry   We discussed possible serious and likely etiologies, options for evaluation and workup, limitations of telemedicine visit vs in person visit, treatment, treatment risks and precautions. The patient was advised to call back or seek an in-person evaluation if the symptoms worsen or if the condition fails to improve as anticipated. I discussed the assessment and treatment plan with the patient. The patient was provided an opportunity to ask questions and all were answered. The patient agreed with the plan and demonstrated an understanding of the instructions.  Return in about 6 weeks (around 12/07/2023).  I, Rolla Etienne Wierda, acting as a scribe for Konstantin Lehnen [place], MD., have documented all relevant  documentation on the behalf of Declynn Lopresti [place], MD, as directed by  Kweku Stankey [place], MD while in the presence of Luda Charbonneau [place], MD.   I, Kali Ambler [place], MD, have reviewed all documentation for this visit. The documentation on 10/29/23 for the exam, diagnosis, procedures, and orders are all accurate and complete.  Lizzete Gough [place], MD

## 2023-10-26 NOTE — Assessment & Plan Note (Signed)
Reported as stable. Continue Sertraline 50 mg daily. Buspar 5 mg bid added today to help with depression. CBT q 2 weeks to continue. Psychiatrist referral placed.

## 2023-11-09 ENCOUNTER — Other Ambulatory Visit (HOSPITAL_COMMUNITY): Payer: Self-pay

## 2023-11-10 DIAGNOSIS — F411 Generalized anxiety disorder: Secondary | ICD-10-CM | POA: Diagnosis not present

## 2023-11-19 ENCOUNTER — Encounter (HOSPITAL_COMMUNITY): Payer: Self-pay | Admitting: Psychiatry

## 2023-11-19 ENCOUNTER — Other Ambulatory Visit: Payer: Self-pay

## 2023-11-19 ENCOUNTER — Other Ambulatory Visit (HOSPITAL_COMMUNITY): Payer: Self-pay

## 2023-11-19 ENCOUNTER — Ambulatory Visit (INDEPENDENT_AMBULATORY_CARE_PROVIDER_SITE_OTHER): Payer: 59 | Admitting: Psychiatry

## 2023-11-19 DIAGNOSIS — F411 Generalized anxiety disorder: Secondary | ICD-10-CM | POA: Diagnosis not present

## 2023-11-19 DIAGNOSIS — F3181 Bipolar II disorder: Secondary | ICD-10-CM

## 2023-11-19 MED ORDER — BUSPIRONE HCL 15 MG PO TABS
15.0000 mg | ORAL_TABLET | Freq: Three times a day (TID) | ORAL | 3 refills | Status: DC
Start: 2023-11-19 — End: 2023-11-19
  Filled 2023-11-19: qty 90, 30d supply, fill #0

## 2023-11-19 MED ORDER — SERTRALINE HCL 50 MG PO TABS
50.0000 mg | ORAL_TABLET | Freq: Every day | ORAL | 3 refills | Status: DC
  Filled 2023-11-19: qty 30, 30d supply, fill #0

## 2023-11-19 MED ORDER — BUSPIRONE HCL 15 MG PO TABS
15.0000 mg | ORAL_TABLET | Freq: Two times a day (BID) | ORAL | 3 refills | Status: DC
  Filled 2023-11-19: qty 60, 30d supply, fill #0
  Filled 2023-12-27: qty 60, 30d supply, fill #1

## 2023-11-19 MED ORDER — RISPERIDONE 1 MG PO TABS
1.0000 mg | ORAL_TABLET | Freq: Every day | ORAL | 3 refills | Status: DC
  Filled 2023-11-19: qty 30, 30d supply, fill #0
  Filled 2023-12-27: qty 30, 30d supply, fill #1
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
## 2023-11-24 DIAGNOSIS — F411 Generalized anxiety disorder: Secondary | ICD-10-CM | POA: Diagnosis not present

## 2023-12-03 ENCOUNTER — Other Ambulatory Visit (HOSPITAL_COMMUNITY): Payer: Self-pay

## 2023-12-03 ENCOUNTER — Ambulatory Visit (INDEPENDENT_AMBULATORY_CARE_PROVIDER_SITE_OTHER): Payer: 59 | Admitting: Family Medicine

## 2023-12-03 ENCOUNTER — Encounter: Payer: Self-pay | Admitting: Family Medicine

## 2023-12-03 VITALS — BP 120/70 | HR 85 | Resp 12 | Ht 63.5 in | Wt 112.4 lb

## 2023-12-03 DIAGNOSIS — G40309 Generalized idiopathic epilepsy and epileptic syndromes, not intractable, without status epilepticus: Secondary | ICD-10-CM | POA: Diagnosis not present

## 2023-12-03 DIAGNOSIS — L739 Follicular disorder, unspecified: Secondary | ICD-10-CM | POA: Diagnosis not present

## 2023-12-03 DIAGNOSIS — G40909 Epilepsy, unspecified, not intractable, without status epilepticus: Secondary | ICD-10-CM

## 2023-12-03 MED ORDER — DOXYCYCLINE HYCLATE 100 MG PO TABS: 100.0000 mg | ORAL_TABLET | Freq: Two times a day (BID) | ORAL | 0 refills | Status: DC

## 2023-12-03 MED ORDER — DOXYCYCLINE HYCLATE 100 MG PO TABS
100.0000 mg | ORAL_TABLET | Freq: Two times a day (BID) | ORAL | 0 refills | Status: AC
  Filled 2023-12-03: qty 14, 7d supply, fill #0

## 2023-12-03 NOTE — Patient Instructions (Addendum)
A few things to remember from today's visit:  Folliculitis of perineum - Plan: doxycycline (VIBRA-TABS) 100 MG tablet Avoid squeezing lesions. Sitz bath may help. Avoid shaving.  If you need refills for medications you take chronically, please call your pharmacy. Do not use My Chart to request refills or for acute issues that need immediate attention. If you send a my chart message, it may take a few days to be addressed, specially if I am not in the office.  Please be sure medication list is accurate. If a new problem present, please set up appointment sooner than planned today.

## 2023-12-03 NOTE — Progress Notes (Signed)
ACUTE VISIT Chief Complaint  Patient presents with   Rash    Had a [place] wax as usual, having bumps now, unsure if ingrown hairs or reaction.    HPI: Lindsay Rosario is a 23 y.o. female with a PMHx significant for epilepsy, insomnia, GAD, depression, and B12 deficiency, who is here today complaining of a rash in the genital area.   Review of Systems See other pertinent positives and negatives in HPI.  Current Outpatient Medications on File Prior to Visit  Medication Sig Dispense Refill   busPIRone (BUSPAR) 15 MG tablet Take 1 tablet (15 mg total) by mouth 2 (two) times daily. 60 tablet 3   dicyclomine (BENTYL) 10 MG capsule Take 1 capsule (10 mg total) by mouth 3 (three) times daily as needed for spasms. 90 capsule 1   etonogestrel-ethinyl estradiol (NUVARING) 0.12-0.015 MG/24HR vaginal ring PLACE 1 RING VAGINALLY EVERY 28 (TWENTY-EIGHT) DAYS. 3 each 2   fluticasone (FLONASE) 50 MCG/ACT nasal spray Place 2 sprays into both nostrils daily. 16 g 6   risperiDONE (RISPERDAL) 1 MG tablet Take 1 tablet (1 mg total) by mouth daily. 30 tablet 3   sertraline (ZOLOFT) 50 MG tablet Take 1 tablet (50 mg total) by mouth daily. 30 tablet 3   triamcinolone cream (KENALOG) 0.1 % Apply 1 application  topically 2 (two) times daily as needed. 30 g 1   zonisamide (ZONEGRAN) 100 MG capsule Take 3 capsules (300 mg total) by mouth daily. 90 capsule 4   No current facility-administered medications on file prior to visit.    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   Routine general medical examination at a health care facility 06/25/2023   Seizures (HCC)    Allergies  Allergen Reactions   Kiwi Extract     Lip goes numb     Social History   Socioeconomic History   Marital status: Significant Other     Spouse name: Not on file   Number of children: 0   Years of education: Not on file   Highest education level: 12th grade  Occupational History   Occupation: [employer]    Comment: Administrator  Tobacco Use   Smoking status: Never   Smokeless tobacco: Never  Vaping Use   Vaping status: Never Used  Substance and Sexual Activity   Alcohol use: No   Drug use: Never   Sexual activity: Yes    Birth control/protection: Inserts  Other Topics Concern   Not on file  Social History Narrative   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 She does well in school.   Lives with her grandparents   Social Drivers of Health   Financial Resource Strain: Medium Risk (11/20/2022)   Overall Financial Resource Strain (CARDIA)    Difficulty of Paying Living Expenses: Somewhat hard  Food Insecurity: Food Insecurity Present (11/20/2022)   Hunger Vital Sign    Worried About Running Out of Food in the Last Year: Sometimes true    Ran Out of Food in the Last Year: Never true  Transportation Needs: No Transportation Needs (11/20/2022)   PRAPARE - Administrator, Civil Service (Medical): No    Lack of Transportation (Non-Medical): No  Physical Activity: Unknown (11/20/2022)  Exercise Vital Sign    Days of Exercise per Week: 0 days    Minutes of Exercise per Session: Not on file  Stress: Stress Concern Present (11/20/2022)   [employer] of Occupational Health - Occupational Stress Questionnaire    Feeling of Stress : Very much  Social Connections: Moderately Integrated (11/20/2022)   Social Connection and Isolation Panel [NHANES]    Frequency of Communication with Friends and Family: More than three times a week    Frequency of Social Gatherings with Friends and Family: Twice a week    Attends Religious Services: 1 to 4 times per year    Active Member of [employer] or Organizations: No    Attends Banker Meetings: Not  on file    Marital Status: Living with partner    Vitals:   12/03/23 0840  BP: 120/70  Pulse: 85  Resp: 12  SpO2: 97%   Body mass index is 19.59 kg/m.  Physical Exam Vitals and nursing note reviewed. Exam conducted with a chaperone present.  Constitutional:      General: She is not in acute distress.    Appearance: She is well-developed.  HENT:     Head: Atraumatic.  Eyes:     Conjunctiva/sclera: Conjunctivae normal.  Pulmonary:     Effort: Pulmonary effort is normal. No respiratory distress.     Breath sounds: Normal breath sounds.  Genitourinary:   Lymphadenopathy:     Cervical: No cervical adenopathy.  Skin:    General: Skin is warm.     Findings: Rash present.  Neurological:     Mental Status: She is alert and oriented to person, place, and time.  Psychiatric:        Mood and Affect: Affect normal. Mood is anxious.     Comments: Well groomed, good eye contact.    ASSESSMENT AND PLAN:  Lindsay Rosario was seen today for a genital rash.   Folliculitis of perineum -     Doxycycline Hyclate; Take 1 tablet (100 mg total) by mouth 2 (two) times daily for 7 days.  Dispense: 14 tablet; Refill: 0  Nonintractable epilepsy without status epilepticus, unspecified epilepsy type (HCC)   Return if symptoms worsen or fail to improve.  Lindsay Yazdani G. [place], MD  [facility]. [place] office.

## 2023-12-08 DIAGNOSIS — F411 Generalized anxiety disorder: Secondary | ICD-10-CM | POA: Diagnosis not present

## 2023-12-14 DIAGNOSIS — F411 Generalized anxiety disorder: Secondary | ICD-10-CM | POA: Diagnosis not present

## 2023-12-22 ENCOUNTER — Other Ambulatory Visit: Payer: Self-pay | Admitting: Medical Genetics

## 2023-12-30 DIAGNOSIS — F428 Other obsessive-compulsive disorder: Secondary | ICD-10-CM | POA: Diagnosis not present

## 2024-01-06 ENCOUNTER — Other Ambulatory Visit (HOSPITAL_COMMUNITY): Payer: Self-pay

## 2024-01-07 ENCOUNTER — Other Ambulatory Visit (HOSPITAL_COMMUNITY): Payer: Self-pay | Attending: Medical Genetics

## 2024-01-14 DIAGNOSIS — F428 Other obsessive-compulsive disorder: Secondary | ICD-10-CM | POA: Diagnosis not present

## 2024-01-28 ENCOUNTER — Other Ambulatory Visit (HOSPITAL_COMMUNITY): Payer: Self-pay

## 2024-01-28 ENCOUNTER — Telehealth (HOSPITAL_COMMUNITY): Payer: 59 | Admitting: Psychiatry

## 2024-01-28 ENCOUNTER — Encounter (HOSPITAL_COMMUNITY): Payer: Self-pay | Admitting: Psychiatry

## 2024-01-28 DIAGNOSIS — F411 Generalized anxiety disorder: Secondary | ICD-10-CM | POA: Diagnosis not present

## 2024-01-28 DIAGNOSIS — F3181 Bipolar II disorder: Secondary | ICD-10-CM | POA: Diagnosis not present

## 2024-01-28 DIAGNOSIS — F428 Other obsessive-compulsive disorder: Secondary | ICD-10-CM | POA: Diagnosis not present

## 2024-01-28 MED ORDER — SERTRALINE HCL 50 MG PO TABS
50.0000 mg | ORAL_TABLET | Freq: Every day | ORAL | 3 refills | Status: DC
  Filled 2024-01-28: qty 30, 30d supply, fill #0
  Filled 2024-03-05: qty 30, 30d supply, fill #1

## 2024-01-28 MED ORDER — BUSPIRONE HCL 15 MG PO TABS
15.0000 mg | ORAL_TABLET | Freq: Two times a day (BID) | ORAL | 3 refills | Status: DC
  Filled 2024-01-28: qty 60, 30d supply, fill #0
  Filled 2024-03-05: qty 60, 30d supply, fill #1

## 2024-01-28 MED ORDER — RISPERIDONE 2 MG PO TABS
2.0000 mg | ORAL_TABLET | Freq: Every day | ORAL | 3 refills | Status: DC
Start: 2024-01-28 — End: 2024-03-31
  Filled 2024-01-28: qty 30, 30d supply, fill #0
  Filled 2024-03-05: qty 30, 30d supply, fill #1

## 2024-01-28 NOTE — Progress Notes (Addendum)
 BH MD/PA/NP OP Progress Note Virtual Visit via Video Note  I connected with Lindsay Rosario on 02/04/24 at  9:30 AM EDT by a video enabled telemedicine application and verified that I am speaking with the correct person using two identifiers.  Location: Patient: Home Provider: Clinic   I discussed the limitations of evaluation and management by telemedicine and the availability of in person appointments. The patient expressed understanding and agreed to proceed.  I provided 30 minutes of non-face-to-face time during this encounter.   01/28/2024 10:10 AM Lindsay Rosario  MRN:  [id number]  Chief Complaint: I was diagnosed with OCD  HPI:  23 year old female seen today for follow-up psychiatric evaluation.  She has a psychiatric history of anxiety and depression.  Currently she is managed on Risperdal 1 mg daily (patient works at night), BuSpar 15 mg twice daily and Zoloft 50 mg daily.  She reports her medications are somewhat effective in managing her psychiatric conditions.  Today she is well-groomed, pleasant, cooperative, and engaged in conversation.  She informed Clinical research associate that recently she was diagnosed with OCD by her counselor.  She informed Clinical research associate that she has patterns that she must follow-up or she becomes anxious.  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  She also reports that she checks frequently.  If she does not complete the task she becomes anxious.   Patient notes that she sees the benefits of Risperdal.  She informed writer that her moods has improved and notes that her anxiety and depression has gotten better.  Patient notes that she continues to impulsively spend.  She notes that she primarily spends money on food.  She denies racing thoughts, fluctuations in mood, irritability, or distractibility.  She also informed writer that her sleep has improved.   Patient no longer sleeps 9 to 12 hours daily now she sleeps 8 hours.  She does note that since starting Risperdal her appetite has somewhat increased.  She denies weight gain.  Today she denies SI/HI/AH.  Patient reports that at times she feels that she sees bugs but notes that she believes that these are just lights reflecting off of things.  She notes that she feels a sense of paranoia around the shadowy figures noting that her mind is jumps to it being bugs.  She reports that she sees bugs not only at work but also in her home.  Patient also notes that she becomes hypervigilant about if her coworkers will talk about her.  She notes that she tries to stay busy so that they will have a good perception of her.     Today provider conducted a GAD-7 and patient scored a 12 at her last visit she scored 15.  Provider also conducted PHQ-9 the patient scored a 12, at her last visit she scored a 21.    Patient requested that Risperdal be increased.  Provider agreeable to increasing Risperdal 1 mg to 2 mg daily (patient works at night).  She will continue other medications as prescribed. Visit Diagnosis:    ICD-10-CM   1. Generalized anxiety disorder  F41.1 sertraline (ZOLOFT) 50 MG tablet    busPIRone (BUSPAR) 15 MG tablet    2. Bipolar 2 disorder, major depressive episode (HCC)  F31.81 risperiDONE (RISPERDAL) 2 MG tablet      Past Psychiatric History:  Anxiety and depression   Past Medical  History:  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   Routine general medical examination at a health care facility 06/25/2023   Seizures [facility])     Past Surgical History:  Procedure Laterality Date   MOUTH SURGERY     wisdom teeth removed x 2    Family Psychiatric History:  Mother bipolar disorder, maternal grandmother  anxiety and bipolar disorder, aunt bipolar disorder and anxiety   Family History:  Family History  Problem Relation Age of Onset   Migraines Mother    Migraines Brother    Irritable bowel syndrome Maternal Aunt    Colon cancer Neg Hx    Esophageal cancer Neg Hx     Social History:  Social History   Socioeconomic History   Marital status: Single    Spouse name: Not on file   Number of children: 0   Years of education: Not on file   Highest education level: 12th grade  Occupational History   Occupation:     Comment: Administrator  Tobacco Use   Smoking status: Never   Smokeless tobacco: Never  Vaping Use   Vaping status: Never Used  Substance and Sexual Activity   Alcohol use: No   Drug use: Never   Sexual activity: Yes    Birth control/protection: Inserts  Other Topics Concern   Not on file  Social History Narrative   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 She does well in school.   Lives with her grandparents   Social Drivers of Health   Financial Resource Strain: Medium Risk (11/20/2022)   Overall Financial Resource Strain (CARDIA)    Difficulty of Paying Living Expenses: Somewhat hard  Food Insecurity: Food Insecurity Present (11/20/2022)   Hunger Vital Sign    Worried About Running Out of Food in the Last Year: Sometimes true    Ran Out of Food in the Last Year: Never true  Transportation Needs: No Transportation Needs (11/20/2022)   PRAPARE - Administrator, Civil Service (Medical): No    Lack of Transportation (Non-Medical): No  Physical Activity: Unknown (11/20/2022)   Exercise Vital Sign    Days of Exercise per Week: 0 days    Minutes of Exercise per Session: Not on file  Stress: Stress Concern Present (11/20/2022)   [employer] of Occupational Health - Occupational Stress Questionnaire    Feeling of Stress : Very much  Social Connections: Moderately Integrated  (11/20/2022)   Social Connection and Isolation Panel [NHANES]    Frequency of Communication with Friends and Family: More than three times a week    Frequency of Social Gatherings with Friends and Family: Twice a week    Attends Religious Services: 1 to 4 times per year    Active Member of [employer] or Organizations: No    Attends Engineer, structural: Not on file    Marital Status: Living with partner    Allergies:  Allergies  Allergen Reactions   Kiwi Extract     Lip goes numb     Metabolic Disorder Labs: Lab Results  Component Value Date   HGBA1C 5.2 05/19/2022   No results found for: "PROLACTIN" No results found for: "CHOL", "TRIG", "HDL", "CHOLHDL", "VLDL", "LDLCALC" Lab Results  Component  Value Date   TSH 2.21 05/19/2022    Therapeutic Level Labs: No results found for: "LITHIUM" No results found for: "VALPROATE" No results found for: "CBMZ"  Current Medications: Current Outpatient Medications  Medication Sig Dispense Refill   busPIRone (BUSPAR) 15 MG tablet Take 1 tablet (15 mg total) by mouth 2 (two) times daily. 60 tablet 3   dicyclomine (BENTYL) 10 MG capsule Take 1 capsule (10 mg total) by mouth 3 (three) times daily as needed for spasms. 90 capsule 1   risperiDONE (RISPERDAL) 2 MG tablet Take 1 tablet (2 mg total) by mouth daily. 30 tablet 3   sertraline (ZOLOFT) 50 MG tablet Take 1 tablet (50 mg total) by mouth daily. 30 tablet 3   triamcinolone cream (KENALOG) 0.1 % Apply 1 application  topically 2 (two) times daily as needed. 30 g 1   zonisamide (ZONEGRAN) 100 MG capsule Take 3 capsules (300 mg total) by mouth daily. 90 capsule 4   No current facility-administered medications for this visit.     Musculoskeletal: Strength & Muscle Tone: within normal limits and telehealth visit Gait & Station: normal, telehealth visit Patient leans: N/A  Psychiatric Specialty Exam: Review of Systems  There were no vitals taken for this visit.There is no height  or weight on file to calculate BMI.  General Appearance: Well Groomed  Eye Contact:  Good  Speech:  Clear and Coherent and Normal Rate  Volume:  Normal  Mood:  Euthymic  Affect:  Appropriate and Congruent  Thought Process:  Coherent, Goal Directed, and Linear  Orientation:  Full (Time, Place, and Person)  Thought Content: Logical and Paranoid Ideation   Suicidal Thoughts:  No  Homicidal Thoughts:  No  Memory:  Immediate;   Good Recent;   Good Remote;   Good  Judgement:  Good  Insight:  Good  Psychomotor Activity:  Normal  Concentration:  Concentration: Good and Attention Span: Good  Recall:  Good  Fund of Knowledge: Good  Language: Good  Akathisia:  No  Handed:  Right  AIMS (if indicated): not done  Assets:  Communication Skills Desire for Improvement Financial Resources/Insurance Housing Leisure Time Physical Health Social Support Talents/Skills Vocational/Educational  ADL's:  Intact  Cognition: WNL  Sleep:  Good   Screenings: GAD-7    Flowsheet Row Video Visit from 01/28/2024 in [facility] Office Visit from 11/19/2023 in [facility] Office Visit from 04/14/2022 in [facility] [place] Office Visit from 10/13/2021 in [facility]  Total GAD-7 Score 12 15 17 10       PHQ2-9    Flowsheet Row Video Visit from 01/28/2024 in [facility] Office Visit from 11/19/2023 in [facility] Office Visit from 06/23/2022 in [facility] [place] Office Visit from 05/19/2022 in [facility] [place] Office Visit from 04/14/2022 in [facility] [place]  PHQ-2 Total Score 4 5 2 2 2   PHQ-9 Total Score 12 21 12 8 16       Flowsheet Row ED from 08/13/2022 in [facility] at [facility] ED from 07/12/2022 in [facility] ED from 11[facility]13/2021 in [facility] at [facility])  C-SSRS RISK CATEGORY No Risk No Risk No Risk        Assessment and Plan: Patient reports that her mood, anxiety, and depression has  improved since her last visit.  She does note that at times she continues to be paranoid and has impulsive spending.  Patient requested that Risperdal be increased.  Provider agreeable to increasing Risperdal 1 mg to 2 mg daily (patient works at night).  She will continue other medications as prescribed.  1. Generalized anxiety disorder  Continue- sertraline (ZOLOFT) 50 MG tablet; Take 1 tablet (50 mg total) by mouth daily.  Dispense: 30 tablet; Refill: 3 Continue- busPIRone (BUSPAR) 15 MG tablet; Take 1 tablet (15 mg total) by mouth 2 (two) times daily.  Dispense: 60 tablet; Refill: 3  2. Bipolar 2 disorder, major depressive episode (HCC)  Increased- risperiDONE (RISPERDAL) 2 MG tablet; Take 1 tablet (2 mg total) by mouth daily.  Dispense: 30 tablet; Refill: 3   Collaboration of Care: Collaboration of Care: Other provider involved in patient's care AEB PCP  Patient/Guardian was advised Release of Information must be obtained prior to any record release in order to collaborate their care with an outside provider. Patient/Guardian was advised if they have not already done so to contact the registration department to sign all necessary forms in order for [place] to release information regarding their care.   Consent: Patient/Guardian gives verbal consent for treatment and assignment of benefits for services provided during this visit. Patient/Guardian expressed understanding and agreed to proceed.   Follow-up in 2 months Shanna Cisco, NP 01/28/2024, 10:10 AM

## 2024-02-01 ENCOUNTER — Other Ambulatory Visit (HOSPITAL_COMMUNITY): Payer: Self-pay
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
## 2024-02-09 ENCOUNTER — Ambulatory Visit (INDEPENDENT_AMBULATORY_CARE_PROVIDER_SITE_OTHER): Payer: 59 | Admitting: Neurology

## 2024-02-09 ENCOUNTER — Other Ambulatory Visit (HOSPITAL_COMMUNITY): Payer: Self-pay

## 2024-02-09 ENCOUNTER — Encounter: Payer: Self-pay | Admitting: Neurology

## 2024-02-09 VITALS — BP 118/73 | HR 68 | Ht 65.0 in | Wt 112.0 lb

## 2024-02-09 DIAGNOSIS — G40909 Epilepsy, unspecified, not intractable, without status epilepticus: Secondary | ICD-10-CM

## 2024-02-09 DIAGNOSIS — G44219 Episodic tension-type headache, not intractable: Secondary | ICD-10-CM

## 2024-02-09 MED ORDER — ZONISAMIDE 100 MG PO CAPS
300.0000 mg | ORAL_CAPSULE | Freq: Every day | ORAL | 3 refills | Status: AC
  Filled 2024-02-09 – 2024-02-25 (×2): qty 270, 90d supply, fill #0
  Filled 2024-05-22: qty 270, 90d supply, fill #1
  Filled 2024-09-04 (×2): qty 270, 90d supply, fill #2
  Filled 2024-12-04: qty 270, 90d supply, fill #0

## 2024-02-11 DIAGNOSIS — F428 Other obsessive-compulsive disorder: Secondary | ICD-10-CM | POA: Diagnosis not present

## 2024-02-21 ENCOUNTER — Other Ambulatory Visit (HOSPITAL_COMMUNITY): Payer: Self-pay

## 2024-02-22 ENCOUNTER — Other Ambulatory Visit (HOSPITAL_COMMUNITY)
Admission: RE | Admit: 2024-02-22 | Discharge: 2024-02-22 | Disposition: A | Discharge status: Home / Self Care | Source: Ambulatory Visit | Attending: Family Medicine | Admitting: Family Medicine

## 2024-02-22 ENCOUNTER — Ambulatory Visit (INDEPENDENT_AMBULATORY_CARE_PROVIDER_SITE_OTHER): Admitting: Family Medicine

## 2024-02-22 ENCOUNTER — Encounter: Payer: Self-pay | Admitting: Family Medicine

## 2024-02-22 VITALS — BP 122/80 | HR 87 | Temp 97.7°F | Resp 12 | Ht 65.0 in | Wt 120.6 lb

## 2024-02-22 DIAGNOSIS — N898 Other specified noninflammatory disorders of vagina: Secondary | ICD-10-CM

## 2024-02-22 DIAGNOSIS — Z113 Encounter for screening for infections with a predominantly sexual mode of transmission: Secondary | ICD-10-CM | POA: Insufficient documentation

## 2024-02-22 DIAGNOSIS — B3731 Acute candidiasis of vulva and vagina: Secondary | ICD-10-CM

## 2024-02-22 MED ORDER — FLUCONAZOLE 150 MG PO TABS: 150.0000 mg | ORAL_TABLET | ORAL | 0 refills | Status: AC

## 2024-02-22 NOTE — Patient Instructions (Addendum)
 A few things to remember from today's visit:  Vaginal discharge - Plan: Cervicovaginal ancillary only  Screen for STD (sexually transmitted disease) - Plan: RPR, HIV Antibody (routine testing w rflx), Cervicovaginal ancillary only  Vagina, candidiasis - Plan: fluconazole  (DIFLUCAN ) 150 MG tablet Will treat as yeast for now. Continue monistat daily for 10 days. Diflucan  1 tab weekly for 2 weeks.  If you need refills for medications you take chronically, please call your pharmacy. Do not use My Chart to request refills or for acute issues that need immediate attention. If you send a my chart message, it may take a few days to be addressed, specially if I am not in the office.  Please be sure medication list is accurate. If a new problem present, please set up appointment sooner than planned today.

## 2024-02-22 NOTE — Progress Notes (Unsigned)
 ACUTE VISIT Chief Complaint  Patient presents with   Vaginitis    suspected   HPI: [name] is a 23 y.o. female with a PMHx significant for epilepsy, insomnia, GAD, depression, bipolar II, and B12 deficiency, who is here today complaining of vaginal pruritus. Problem started 7-10 days ago, a couple days after sex intercourse with new sex partner. States that he used a condom but was loose. More discharge than usual, which she describes as cottage cheese.  Vaginal Itching The patient's primary symptoms include genital itching and vaginal discharge. The patient's pertinent negatives include no genital lesions, genital odor, genital rash, missed menses, pelvic pain or vaginal bleeding. The current episode started in the past 7 days. The problem has been unchanged. The patient is experiencing no pain. The problem affects both sides. She is not pregnant. Pertinent negatives include no abdominal pain, anorexia, back pain, chills, discolored urine, dysuria, fever, flank pain, frequency, headaches, hematuria, joint pain, joint swelling, nausea, painful intercourse, rash, sore throat, urgency or vomiting. There has been no bleeding. She has not been passing clots. She has not been passing tissue. Nothing aggravates the symptoms. She has tried antifungals for the symptoms. The treatment provided no relief. She is sexually active. No, her partner does not have an STD. She uses condoms for contraception. Her menstrual history has been regular.  She has not noted genital lesions, edema,or erythema. She has been treated for chlamydia in the past.  A week ago she used OTC monistat vaginal tab. LMP 02/18/24.  Review of Systems  Constitutional:  Negative for chills and fever.  HENT:  Negative for mouth sores and sore throat.   Respiratory:  Negative for cough and shortness of breath.   Cardiovascular:  Negative for leg swelling.  Gastrointestinal:  Negative for abdominal pain, anorexia, nausea  and vomiting.  Genitourinary:  Positive for vaginal discharge. Negative for dysuria, flank pain, frequency, hematuria, missed menses, pelvic pain and urgency.  Musculoskeletal:  Negative for back pain, joint pain and joint swelling.  Skin:  Negative for rash.  Neurological:  Negative for syncope and headaches.  Psychiatric/Behavioral:  Negative for confusion. The patient is nervous/anxious.   See other pertinent positives and negatives in HPI.  Current Outpatient Medications on File Prior to Visit  Medication Sig Dispense Refill   busPIRone  (BUSPAR ) 15 MG tablet Take 1 tablet (15 mg total) by mouth 2 (two) times daily. 60 tablet 3   risperiDONE  (RISPERDAL ) 2 MG tablet Take 1 tablet (2 mg total) by mouth daily. 30 tablet 3   sertraline  (ZOLOFT ) 50 MG tablet Take 1 tablet (50 mg total) by mouth daily. 30 tablet 3   triamcinolone  cream (KENALOG ) 0.1 % Apply 1 application  topically 2 (two) times daily as needed. 30 g 1   zonisamide  (ZONEGRAN ) 100 MG capsule Take 3 capsules (300 mg total) by mouth daily. 270 capsule 3   No current facility-administered medications on file prior to visit.   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   Routine general medical examination at a health care facility 06/25/2023   Seizures (HCC)    Allergies  Allergen Reactions   Kiwi Extract     Lip goes numb     Social History   Socioeconomic History   Marital status: Single  Spouse name: Not on file   Number of children: 0   Years of education: Not on file   Highest education level: 12th grade  Occupational History   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 She does well in school.   Lives with her grandparents   Social Drivers of Health   Financial Resource Strain: Medium Risk (11/20/2022)   Overall Financial Resource Strain (CARDIA)    Difficulty of Paying Living Expenses: Somewhat hard  Food Insecurity: Food Insecurity Present (11/20/2022)   Hunger Vital Sign    Worried About Running Out of Food in the Last Year: Sometimes true    Ran Out of Food in the Last Year: Never true  Transportation Needs: No Transportation Needs (11/20/2022)   PRAPARE - Administrator, Civil Service (Medical): No    Lack of Transportation (Non-Medical): No  Physical Activity: Unknown (11/20/2022)   Exercise Vital Sign    Days of Exercise per Week: 0 days    Minutes of Exercise per Session: Not on file  Stress: Stress Concern Present (11/20/2022)   [employer] of Occupational Health - Occupational Stress Questionnaire    Feeling of Stress : Very much  Social Connections: Moderately Integrated (11/20/2022)   Social Connection and Isolation Panel [NHANES]    Frequency of Communication with Friends and Family: More than three times a week    Frequency of Social Gatherings with Friends and Family: Twice a week    Attends Religious Services: 1 to 4 times per year    Active Member of [employer] or Organizations: No    Attends Banker Meetings: Not on file    Marital Status: Living with partner    Vitals:   02/22/24 1547  BP: 122/80  Pulse: 87  Resp: 12  Temp: 97.7 F (36.5 C)  SpO2: 98%   Body mass index is 20.07 kg/m.  Physical Exam Vitals and nursing note reviewed. Exam conducted with a chaperone present.  Constitutional:      General: She is not in acute distress.     Appearance: She is well-developed.  HENT:     Head: Normocephalic and atraumatic.  Eyes:     Conjunctiva/sclera: Conjunctivae normal.  Cardiovascular:     Rate and Rhythm: Normal rate and regular rhythm.  Pulmonary:     Effort: Pulmonary effort is normal. No respiratory distress.     Breath sounds: Normal breath sounds.  Abdominal:     Palpations: Abdomen is soft. There is no mass.     Tenderness: There is no abdominal tenderness.  Genitourinary:    Exam position: Lithotomy position.     Labia:        Right: No rash, tenderness or lesion.        Left: No rash, tenderness or lesion.      Vagina: Erythema present. No vaginal discharge, tenderness or lesions.     Cervix: Cervical bleeding present. No erythema.  Musculoskeletal:     Right lower leg: No edema.  Left lower leg: No edema.  Lymphadenopathy:     Lower Body: No right inguinal adenopathy. No left inguinal adenopathy.  Skin:    General: Skin is warm.     Findings: No erythema or rash.  Neurological:     General: No focal deficit present.     Mental Status: She is alert and oriented to person, place, and time.     Gait: Gait normal.  Psychiatric:        Mood and Affect: Affect normal. Mood is anxious.    ASSESSMENT AND PLAN:  Ms. Sonnen was seen today for a yeast infection.   Vulvovaginitis due to yeast Based on hx and clinical findings empiric treatment for mycotic infection started today. Continue OTC Monistat daily x 10 days.  -     Fluconazole ; Take 1 tablet (150 mg total) by mouth once a week for 2 doses.  Dispense: 2 tablet; Refill: 0  Vaginal discharge With associated intense pruritus. We discussed differential Dx. STD prevention discussed. Further recommendations according to lab results.  -     Cervicovaginal ancillary only  Screen for STD (sexually transmitted disease) -     RPR; Future -     HIV Antibody (routine testing w rflx); Future -     Cervicovaginal ancillary only  Return if  symptoms worsen or fail to improve.  Christon Gallaway G. [place], MD  [facility], [facility], [facility]. [place] office.

## 2024-02-23 ENCOUNTER — Encounter: Payer: Self-pay | Admitting: Family Medicine

## 2024-02-23 DIAGNOSIS — F428 Other obsessive-compulsive disorder: Secondary | ICD-10-CM | POA: Diagnosis not present

## 2024-02-23 LAB — HIV ANTIBODY (ROUTINE TESTING W REFLEX): HIV 1&2 Ab, 4th Generation: NONREACTIVE

## 2024-02-23 LAB — RPR: RPR Ser Ql: NONREACTIVE

## 2024-02-24 LAB — CERVICOVAGINAL ANCILLARY ONLY
Bacterial Vaginitis (gardnerella): NEGATIVE
Candida Glabrata: NEGATIVE
Candida Vaginitis: NEGATIVE
Chlamydia: NEGATIVE
Comment: NEGATIVE
Comment: NEGATIVE
Comment: NEGATIVE
Comment: NEGATIVE
Comment: NEGATIVE
Comment: NORMAL
Neisseria Gonorrhea: NEGATIVE
Trichomonas: NEGATIVE

## 2024-02-25 ENCOUNTER — Other Ambulatory Visit (HOSPITAL_COMMUNITY): Payer: Self-pay

## 2024-03-10 DIAGNOSIS — F428 Other obsessive-compulsive disorder: Secondary | ICD-10-CM | POA: Diagnosis not present

## 2024-03-24 DIAGNOSIS — F428 Other obsessive-compulsive disorder: Secondary | ICD-10-CM | POA: Diagnosis not present

## 2024-03-26 ENCOUNTER — Encounter: Payer: Self-pay | Admitting: Family Medicine

## 2024-03-31 ENCOUNTER — Other Ambulatory Visit (HOSPITAL_COMMUNITY): Payer: Self-pay

## 2024-03-31 ENCOUNTER — Other Ambulatory Visit: Payer: Self-pay

## 2024-03-31 ENCOUNTER — Encounter (HOSPITAL_COMMUNITY): Payer: Self-pay | Admitting: Psychiatry

## 2024-03-31 ENCOUNTER — Telehealth (HOSPITAL_COMMUNITY): Admitting: Psychiatry

## 2024-03-31 DIAGNOSIS — F3181 Bipolar II disorder: Secondary | ICD-10-CM | POA: Diagnosis not present

## 2024-03-31 DIAGNOSIS — F411 Generalized anxiety disorder: Secondary | ICD-10-CM

## 2024-03-31 DIAGNOSIS — F428 Other obsessive-compulsive disorder: Secondary | ICD-10-CM | POA: Diagnosis not present

## 2024-03-31 MED ORDER — RISPERIDONE 2 MG PO TABS
2.0000 mg | ORAL_TABLET | Freq: Every day | ORAL | 3 refills | Status: DC
  Filled 2024-03-31: qty 30, 30d supply, fill #0
  Filled 2024-05-22: qty 30, 30d supply, fill #1

## 2024-03-31 MED ORDER — BUSPIRONE HCL 15 MG PO TABS
15.0000 mg | ORAL_TABLET | Freq: Two times a day (BID) | ORAL | 3 refills | Status: DC
  Filled 2024-03-31: qty 60, 30d supply, fill #0
  Filled 2024-05-22: qty 60, 30d supply, fill #1

## 2024-03-31 MED ORDER — SERTRALINE HCL 50 MG PO TABS
50.0000 mg | ORAL_TABLET | Freq: Every day | ORAL | 3 refills | Status: DC
  Filled 2024-03-31: qty 30, 30d supply, fill #0

## 2024-03-31 NOTE — Progress Notes (Signed)
 BH MD/PA/NP OP Progress Note Virtual Visit via Video Note  I connected with Lindsay Rosario on 03/31/24 at  9:30 AM EDT by a video enabled telemedicine application and verified that I am speaking with the correct person using two identifiers.  Location: Patient: Home Provider: Clinic   I discussed the limitations of evaluation and management by telemedicine and the availability of in person appointments. The patient expressed understanding and agreed to proceed.  I provided 30 minutes of non-face-to-face time during this encounter.   03/31/2024 9:47 AM Lindsay Rosario  MRN:  [id number]  Chief Complaint: "I want to go to school and want to start Adderall"  HPI:  23 year old female seen today for follow-up psychiatric evaluation.  She has a psychiatric history of anxiety and depression.  Currently she is managed on Risperdal  2 mg daily (patient works at night), BuSpar  15 mg twice daily and Zoloft  50 mg daily.  She reports her medications are effective in managing her psychiatric conditions.  Today she is well-groomed, pleasant, cooperative, and engaged in conversation.  She informed Clinical research associate that she is considering starting school in the fall.  She notes that she wants to study pharmacology and notes that she would like to trial Adderall.  Patient notes that at times she is forgetful, disorganized, and inattentive to mentally taxing task, and has poor listening skills.  Patient does not have a formal diagnosis of ADHD and does not have neuropsych testing.  Provider informed patient that she would need to be referred to [facility] for psychological testing.  She however reports that she has been testing and notes that she will have these records sent to [facility].  Provider informed patient that prior to starting stimulant she would prefer to try a nonstimulant to help manage her symptoms.  She endorsed understanding.  Provide also informed patient that prior to starting any stimulant a UDS will be required  informed her that it would be conducted throughout the year.  She endorsed understanding.    Since her last visit she informed writer that she worries about everything.  She notes that she worries about her relationships, her family, her finances, work, and starting school.  She informed Clinical research associate that this is situational and notes that she is able to cope with it. Today provider conducted a GAD-7 and patient scored a 15 at her last visit she scored 12.  Provider also conducted PHQ-9 the patient scored a 8, at her last visit she scored a 12.  Since increasing Risperdal  patient notes that she no longer sees shadows or bugs while at work (works in Research scientist (medical)).  Today she denies SI/HI/VAH, mania, paranoia.  Patient denies alcohol, tobacco, and illegal drug use.  No medication changes made.  Patient agreeable to continue medication as prescribed.  No other concerns noted at time.   Visit Diagnosis:    ICD-10-CM   1. Generalized anxiety disorder  F41.1 sertraline  (ZOLOFT ) 50 MG tablet    busPIRone  (BUSPAR ) 15 MG tablet    2. Bipolar 2 disorder, major depressive episode (HCC)  F31.81 risperiDONE  (RISPERDAL ) 2 MG tablet       Past Psychiatric History:  Anxiety and depression   Past Medical History:  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   Routine general medical examination at a health care facility  06/25/2023   Seizures (HCC)     Past Surgical History:  Procedure Laterality Date   MOUTH SURGERY     wisdom teeth removed x 2    Family Psychiatric History:  Mother bipolar disorder, maternal grandmother anxiety and bipolar disorder, aunt bipolar disorder and anxiety   Family History:  Family History  Problem Relation Age of Onset   Migraines Mother    Migraines Brother    Irritable bowel  syndrome Maternal Aunt    Colon cancer Neg Hx    Esophageal cancer Neg Hx     Social History:  Social History   Socioeconomic History   Marital status: Single    Spouse name: Not on file   Number of children: 0   Years of education: Not on file   Highest education level: 12th grade  Occupational History   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 She does well in school.   Lives with her grandparents   Social Drivers of Health   Financial Resource Strain: Medium Risk (11/20/2022)   Overall Financial Resource Strain (CARDIA)    Difficulty of Paying Living Expenses: Somewhat hard  Food Insecurity: Food Insecurity Present (11/20/2022)   Hunger Vital Sign    Worried About Running Out of Food in the Last Year: Sometimes true    Ran Out of Food in the Last Year: Never true  Transportation Needs: No Transportation Needs (11/20/2022)   PRAPARE - Administrator, Civil Service (Medical): No    Lack of Transportation (Non-Medical): No  Physical Activity: Unknown (11/20/2022)   Exercise Vital Sign    Days of Exercise per Week: 0 days    Minutes of Exercise per Session: Not on file  Stress: Stress Concern Present (11/20/2022)   [employer] of Occupational Health - Occupational Stress Questionnaire    Feeling of Stress : Very much  Social Connections: Moderately Integrated (11/20/2022)   Social Connection and Isolation Panel [NHANES]    Frequency of Communication with Friends and Family: More than three times a week    Frequency of Social Gatherings with Friends and  Family: Twice a week    Attends Religious Services: 1 to 4 times per year    Active Member of [employer] or Organizations: No    Attends Engineer, structural: Not on file    Marital Status: Living with partner    Allergies:  Allergies  Allergen Reactions   Kiwi Extract     Lip goes numb     Metabolic Disorder Labs: Lab Results  Component Value Date   HGBA1C 5.2 05/19/2022   No results found for: "PROLACTIN" No results found for: "CHOL", "TRIG", "HDL", "CHOLHDL", "VLDL", "LDLCALC" Lab Results  Component Value Date   TSH 2.21 05/19/2022    Therapeutic Level Labs: No results found for: "LITHIUM" No results found for: "VALPROATE" No results found for: "CBMZ"  Current Medications: Current Outpatient Medications  Medication Sig Dispense Refill   busPIRone  (BUSPAR ) 15 MG tablet Take 1 tablet (15 mg total) by  mouth 2 (two) times daily. 60 tablet 3   risperiDONE  (RISPERDAL ) 2 MG tablet Take 1 tablet (2 mg total) by mouth daily. 30 tablet 3   sertraline  (ZOLOFT ) 50 MG tablet Take 1 tablet (50 mg total) by mouth daily. 30 tablet 3   triamcinolone  cream (KENALOG ) 0.1 % Apply 1 application  topically 2 (two) times daily as needed. 30 g 1   zonisamide  (ZONEGRAN ) 100 MG capsule Take 3 capsules (300 mg total) by mouth daily. 270 capsule 3   No current facility-administered medications for this visit.     Musculoskeletal: Strength & Muscle Tone: within normal limits and telehealth visit Gait & Station: normal, telehealth visit Patient leans: N/A  Psychiatric Specialty Exam: Review of Systems  There were no vitals taken for this visit.There is no height or weight on file to calculate BMI.  General Appearance: Well Groomed  Eye Contact:  Good  Speech:  Clear and Coherent and Normal Rate  Volume:  Normal  Mood:  Euthymic  Affect:  Appropriate and Congruent  Thought Process:  Coherent, Goal Directed, and Linear  Orientation:  Full (Time, Place, and Person)  Thought Content:  WDL and Logical   Suicidal Thoughts:  No  Homicidal Thoughts:  No  Memory:  Immediate;   Good Recent;   Good Remote;   Good  Judgement:  Good  Insight:  Good  Psychomotor Activity:  Normal  Concentration:  Concentration: Good and Attention Span: Good  Recall:  Good  Fund of Knowledge: Good  Language: Good  Akathisia:  No  Handed:  Right  AIMS (if indicated): not done  Assets:  Communication Skills Desire for Improvement Financial Resources/Insurance Housing Leisure Time Physical Health Social Support Talents/Skills Vocational/Educational  ADL's:  Intact  Cognition: WNL  Sleep:  Good   Screenings: GAD-7    Flowsheet Row Video Visit from 03/31/2024 in [facility] Video Visit from 01/28/2024 in [facility] Office Visit from 11/19/2023 in [facility] Office Visit from 04/14/2022 in [facility] [place] Office Visit from 10/13/2021 in [facility]  Total GAD-7 Score 15 12 15 17 10       PHQ2-9    Flowsheet Row Video Visit from 03/31/2024 in [facility] Video Visit from 01/28/2024 in [facility] Office Visit from 11/19/2023 in [facility] Office Visit from 06/23/2022 in [facility] [place] Office Visit from 05/19/2022 in [facility] [place]  PHQ-2 Total Score 2 4 5 2 2   PHQ-9 Total Score 8 12 21 12 8       Flowsheet Row ED from 08/13/2022 in [facility] Department at [facility] UC from 07/12/2022 in [facility] UC from 11/13/2021 in [facility])  C-SSRS RISK CATEGORY No Risk No Risk No Risk        Assessment and Plan: Patient reports that at times she is anxious but reports that she is able to cope  with it.  She also notes that she would like to try Adderall to help manage her concentration.  Provider informed patient that she will first need psychological testing.  She notes that she has had this done and reports that she will have it sent to [facility].  Provider informed patient that a nonstimulant will be trialed prior to a stimulant.  She endorsed understanding and agreed.  Provider also informed patient that a UDS would be required prior to starting a stimulant.  At this time no medication changes made.  Patient agreeable to continue medication as prescribed. 1. Generalized anxiety disorder  Continue- sertraline  (ZOLOFT ) 50 MG tablet; Take 1 tablet (50 mg total) by mouth daily.  Dispense: 30 tablet; Refill: 3 Continue- busPIRone  (BUSPAR ) 15 MG tablet; Take 1 tablet (15 mg total) by mouth 2 (two) times daily.  Dispense: 60 tablet; Refill: 3  2. Bipolar 2 disorder, major depressive episode (HCC)  Continue- risperiDONE  (RISPERDAL ) 2 MG tablet; Take 1 tablet (2 mg total) by mouth daily.  Dispense: 30 tablet; Refill: 3   Collaboration of Care: Collaboration of Care: Other provider involved in patient's care AEB PCP  Patient/Guardian was advised Release of Information must be obtained prior to any record release in order to collaborate their care with an outside provider. Patient/Guardian was advised if they have not already done so to contact the registration department to sign all necessary forms in order for us  to release information regarding their care.   Consent: Patient/Guardian gives verbal consent for treatment and assignment of benefits for services provided during this visit. Patient/Guardian expressed understanding and agreed to proceed.   Follow-up in 3 months Arlyne Bering, NP 03/31/2024, 9:47 AM

## 2024-04-11 ENCOUNTER — Other Ambulatory Visit (HOSPITAL_COMMUNITY): Payer: Self-pay

## 2024-04-20 DIAGNOSIS — F428 Other obsessive-compulsive disorder: Secondary | ICD-10-CM | POA: Diagnosis not present

## 2024-05-03 ENCOUNTER — Other Ambulatory Visit: Payer: Self-pay

## 2024-05-03 ENCOUNTER — Encounter (HOSPITAL_COMMUNITY): Payer: Self-pay

## 2024-05-03 ENCOUNTER — Emergency Department (HOSPITAL_COMMUNITY)
Admission: EM | Admit: 2024-05-03 | Discharge: 2024-05-04 | Disposition: A | Discharge status: Other Acute Inpatient Hospital | Attending: Emergency Medicine | Admitting: Emergency Medicine

## 2024-05-03 DIAGNOSIS — Z1151 Encounter for screening for human papillomavirus (HPV): Secondary | ICD-10-CM | POA: Diagnosis not present

## 2024-05-03 DIAGNOSIS — Z130 Encounter for screening for diseases of the blood and blood-forming organs and certain disorders involving the immune mechanism: Secondary | ICD-10-CM | POA: Diagnosis not present

## 2024-05-03 DIAGNOSIS — Z113 Encounter for screening for infections with a predominantly sexual mode of transmission: Secondary | ICD-10-CM | POA: Diagnosis not present

## 2024-05-03 DIAGNOSIS — Z1389 Encounter for screening for other disorder: Secondary | ICD-10-CM | POA: Diagnosis not present

## 2024-05-03 DIAGNOSIS — F439 Reaction to severe stress, unspecified: Secondary | ICD-10-CM | POA: Diagnosis not present

## 2024-05-03 DIAGNOSIS — Z3202 Encounter for pregnancy test, result negative: Secondary | ICD-10-CM | POA: Diagnosis not present

## 2024-05-03 DIAGNOSIS — Z3009 Encounter for other general counseling and advice on contraception: Secondary | ICD-10-CM | POA: Diagnosis not present

## 2024-05-03 DIAGNOSIS — R9431 Abnormal electrocardiogram [ECG] [EKG]: Secondary | ICD-10-CM | POA: Diagnosis not present

## 2024-05-03 DIAGNOSIS — R3915 Urgency of urination: Secondary | ICD-10-CM | POA: Diagnosis not present

## 2024-05-03 DIAGNOSIS — R45851 Suicidal ideations: Secondary | ICD-10-CM | POA: Diagnosis not present

## 2024-05-03 DIAGNOSIS — F329 Major depressive disorder, single episode, unspecified: Secondary | ICD-10-CM | POA: Insufficient documentation

## 2024-05-03 DIAGNOSIS — Z01419 Encounter for gynecological examination (general) (routine) without abnormal findings: Secondary | ICD-10-CM | POA: Diagnosis not present

## 2024-05-03 DIAGNOSIS — Z124 Encounter for screening for malignant neoplasm of cervix: Secondary | ICD-10-CM | POA: Diagnosis not present

## 2024-05-03 LAB — CBC WITH DIFFERENTIAL/PLATELET
Abs Immature Granulocytes: 0.01 10*3/uL (ref 0.00–0.07)
Basophils Absolute: 0 10*3/uL (ref 0.0–0.1)
Basophils Relative: 0 %
Eosinophils Absolute: 0.1 10*3/uL (ref 0.0–0.5)
Eosinophils Relative: 1 %
HCT: 39.4 % (ref 36.0–46.0)
Hemoglobin: 13 g/dL (ref 12.0–15.0)
Immature Granulocytes: 0 %
Lymphocytes Relative: 35 %
Lymphs Abs: 2.5 10*3/uL (ref 0.7–4.0)
MCH: 29.6 pg (ref 26.0–34.0)
MCHC: 33 g/dL (ref 30.0–36.0)
MCV: 89.7 fL (ref 80.0–100.0)
Monocytes Absolute: 0.5 10*3/uL (ref 0.1–1.0)
Monocytes Relative: 8 %
Neutro Abs: 3.9 10*3/uL (ref 1.7–7.7)
Neutrophils Relative %: 56 %
Platelets: 212 10*3/uL (ref 150–400)
RBC: 4.39 MIL/uL (ref 3.87–5.11)
RDW: 11.9 % (ref 11.5–15.5)
WBC: 7 10*3/uL (ref 4.0–10.5)
nRBC: 0 % (ref 0.0–0.2)

## 2024-05-03 LAB — RAPID URINE DRUG SCREEN, HOSP PERFORMED
Amphetamines: NOT DETECTED
Barbiturates: NOT DETECTED
Benzodiazepines: NOT DETECTED
Cocaine: NOT DETECTED
Opiates: NOT DETECTED
Tetrahydrocannabinol: NOT DETECTED

## 2024-05-03 LAB — COMPREHENSIVE METABOLIC PANEL WITH GFR
ALT: 32 U/L (ref 0–44)
AST: 27 U/L (ref 15–41)
Albumin: 3.9 g/dL (ref 3.5–5.0)
Alkaline Phosphatase: 77 U/L (ref 38–126)
Anion gap: 9 (ref 5–15)
BUN: 8 mg/dL (ref 6–20)
CO2: 21 mmol/L — ABNORMAL LOW (ref 22–32)
Calcium: 9.1 mg/dL (ref 8.9–10.3)
Chloride: 107 mmol/L (ref 98–111)
Creatinine, Ser: 0.94 mg/dL (ref 0.44–1.00)
GFR, Estimated: >60 mL/min (ref >60)
Glucose, Bld: 94 mg/dL (ref 70–99)
Potassium: 3.7 mmol/L (ref 3.5–5.1)
Sodium: 137 mmol/L (ref 135–145)
Total Bilirubin: 0.5 mg/dL (ref 0.0–1.2)
Total Protein: 7 g/dL (ref 6.5–8.1)

## 2024-05-03 LAB — HCG, SERUM, QUALITATIVE: Preg, Serum: NEGATIVE

## 2024-05-03 LAB — ETHANOL: Alcohol, Ethyl (B): <15 mg/dL (ref <15)

## 2024-05-03 NOTE — ED Triage Notes (Signed)
 Pt c.o suicidal thoughts. Pt tearful

## 2024-05-03 NOTE — ED Provider Notes (Signed)
 Lindsay Rosario EMERGENCY DEPARTMENT AT [facility] Provider Note   CSN: [id number] Arrival date & time: 05/03/24  1740     Patient presents with: Suicidal   Lindsay Rosario is a 23 y.o. female patient who presents to the emerged from today for further evaluation of suicidal ideations.  She states she is not really having any increased stressors in her life and has had these thoughts intermittently for some time.  She denies any intentional plan, auditory hallucinations, visual hallucinations.  Denies any homicidal ideations.  Denies any somatic complaints.  HPI     Prior to Admission medications   Medication Sig Start Date End Date Taking? Authorizing Provider  busPIRone  (BUSPAR ) 15 MG tablet Take 1 tablet (15 mg total) by mouth 2 (two) times daily. 03/31/24   Harl Zane BRAVO, NP  risperiDONE  (RISPERDAL ) 2 MG tablet Take 1 tablet (2 mg total) by mouth daily. 03/31/24   Harl Zane BRAVO, NP  sertraline  (ZOLOFT ) 50 MG tablet Take 1 tablet (50 mg total) by mouth daily. 03/31/24   Parsons, Brittney E, NP  triamcinolone  cream (KENALOG ) 0.1 % Apply 1 application  topically 2 (two) times daily as needed. 04/14/22   [place], Betty G, MD  zonisamide  (ZONEGRAN ) 100 MG capsule Take 3 capsules (300 mg total) by mouth daily. 02/09/24   Skeet Juliene SAUNDERS, DO    Allergies: Kiwi extract    Review of Systems  All other systems reviewed and are negative.   Updated Vital Signs BP 117/85   Pulse 98   Temp 98.9 F (37.2 C)   Resp 14   Ht 5' 5 (1.651 m)   Wt 56.2 kg   SpO2 98%   BMI 20.63 kg/m   Physical Exam Vitals and nursing note reviewed.  Constitutional:      General: She is not in acute distress.    Appearance: Normal appearance.  HENT:     Head: Normocephalic and atraumatic.  Eyes:     General:        Right eye: No discharge.        Left eye: No discharge.  Cardiovascular:     Comments: Regular rate and rhythm.  S1/S2 are distinct without any evidence of murmur, rubs, or  gallops.  Radial pulses are 2+ bilaterally.  Dorsalis pedis pulses are 2+ bilaterally.  No evidence of pedal edema. Pulmonary:     Comments: Clear to auscultation bilaterally.  Normal effort.  No respiratory distress.  No evidence of wheezes, rales, or rhonchi heard throughout. Abdominal:     General: Abdomen is flat. Bowel sounds are normal. There is no distension.     Tenderness: There is no abdominal tenderness. There is no guarding or rebound.  Musculoskeletal:        General: Normal range of motion.     Cervical back: Neck supple.  Skin:    General: Skin is warm and dry.     Findings: No rash.  Neurological:     General: No focal deficit present.     Mental Status: She is alert.  Psychiatric:        Mood and Affect: Mood normal.        Behavior: Behavior normal.     (all labs ordered are listed, but only abnormal results are displayed) Labs Reviewed  COMPREHENSIVE METABOLIC PANEL WITH GFR - Abnormal; Notable for the following components:      Result Value   CO2 21 (*)    All other components within normal  limits  ETHANOL  RAPID URINE DRUG SCREEN, HOSP PERFORMED  CBC WITH DIFFERENTIAL/PLATELET  HCG, SERUM, QUALITATIVE    EKG: None  Radiology: No results found.   Procedures   Medications Ordered in the ED - No data to display   Medical Decision Making Lindsay Rosario is a 23 y.o. female patient who presents to the emergency department today for further evaluation of suicidal ideations.  I saw this patient in triage and she seems very appropriate.  Given her suicidal ideations I do feel she would likely benefit from further evaluation talking with the psychiatric team.  I have reviewed all of her labs and they are normal.  She is medically cleared to go over to [facility].  Patient agreeable with plan.   Amount and/or Complexity of Data Reviewed Labs: ordered.     Final diagnoses:  None    ED Discharge Orders     None          Lindsay Rosario 05/03/24 2043    Lindsay Sid SAILOR, MD 05/05/24 [phone]

## 2024-05-03 NOTE — ED Triage Notes (Signed)
 Pt to er, pt states that she doesn't have anything overwhelming in her life, no trauma or event; however, she is still having si and has been feeling overwhelmed and suicidal for the past two weeks.

## 2024-05-03 NOTE — ED Notes (Signed)
 TTS in process
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
## 2024-05-03 NOTE — ED Provider Triage Note (Signed)
 Emergency Medicine Provider Triage Evaluation Note  Lindsay Rosario , a 23 y.o. female  was evaluated in triage.  Pt complains of suicidal ideations.  No homicidal ideations.  No specific plan.  She denies any somatic complaints at this time.  Review of Systems  Positive:  Negative: See above   Physical Exam  BP 117/85   Pulse 98   Temp 98.9 F (37.2 C)   Resp 14   Ht 5' 5 (1.651 m)   Wt 56.2 kg   SpO2 98%   BMI 20.63 kg/m  Gen:   Awake, no distress   Resp:  Normal effort  MSK:   Moves extremities without difficulty  Other:    Medical Decision Making  Medically screening exam initiated at 7:19 PM.  Appropriate orders placed.  Lauree Yurick Dec was informed that the remainder of the evaluation will be completed by another provider, this initial triage assessment does not replace that evaluation, and the importance of remaining in the ED until their evaluation is complete.     Theotis Peers [place], [place] 05/03/24 1920

## 2024-05-04 ENCOUNTER — Encounter (HOSPITAL_COMMUNITY): Payer: Self-pay | Admitting: Psychiatry

## 2024-05-04 ENCOUNTER — Other Ambulatory Visit: Payer: Self-pay

## 2024-05-04 ENCOUNTER — Inpatient Hospital Stay (HOSPITAL_COMMUNITY)
Admission: AD | Admit: 2024-05-04 | Discharge: 2024-05-05 | DRG: 885 | Disposition: A | Discharge status: Home / Self Care | Source: Intra-hospital

## 2024-05-04 DIAGNOSIS — Z5941 Food insecurity: Secondary | ICD-10-CM

## 2024-05-04 DIAGNOSIS — Z79899 Other long term (current) drug therapy: Secondary | ICD-10-CM

## 2024-05-04 DIAGNOSIS — F429 Obsessive-compulsive disorder, unspecified: Secondary | ICD-10-CM | POA: Diagnosis present

## 2024-05-04 DIAGNOSIS — F422 Mixed obsessional thoughts and acts: Secondary | ICD-10-CM

## 2024-05-04 DIAGNOSIS — R45851 Suicidal ideations: Secondary | ICD-10-CM | POA: Diagnosis present

## 2024-05-04 DIAGNOSIS — F3181 Bipolar II disorder: Secondary | ICD-10-CM | POA: Diagnosis not present

## 2024-05-04 DIAGNOSIS — G479 Sleep disorder, unspecified: Secondary | ICD-10-CM | POA: Diagnosis not present

## 2024-05-04 DIAGNOSIS — E739 Lactose intolerance, unspecified: Secondary | ICD-10-CM | POA: Diagnosis not present

## 2024-05-04 DIAGNOSIS — Z62810 Personal history of physical and sexual abuse in childhood: Secondary | ICD-10-CM | POA: Diagnosis not present

## 2024-05-04 DIAGNOSIS — G40409 Other generalized epilepsy and epileptic syndromes, not intractable, without status epilepticus: Secondary | ICD-10-CM | POA: Diagnosis present

## 2024-05-04 DIAGNOSIS — G40309 Generalized idiopathic epilepsy and epileptic syndromes, not intractable, without status epilepticus: Secondary | ICD-10-CM | POA: Diagnosis present

## 2024-05-04 DIAGNOSIS — F411 Generalized anxiety disorder: Secondary | ICD-10-CM | POA: Diagnosis present

## 2024-05-04 DIAGNOSIS — Z818 Family history of other mental and behavioral disorders: Secondary | ICD-10-CM | POA: Diagnosis not present

## 2024-05-04 DIAGNOSIS — Z9102 Food additives allergy status: Secondary | ICD-10-CM

## 2024-05-04 DIAGNOSIS — Z814 Family history of other substance abuse and dependence: Secondary | ICD-10-CM | POA: Diagnosis not present

## 2024-05-04 LAB — LIPID PANEL
Cholesterol: 196 mg/dL (ref 0–200)
HDL: 99 mg/dL (ref >40)
LDL Cholesterol: 85 mg/dL (ref 0–99)
Total CHOL/HDL Ratio: 2 ratio
Triglycerides: 62 mg/dL (ref <150)
VLDL: 12 mg/dL (ref 0–40)

## 2024-05-04 LAB — HEMOGLOBIN A1C
Hgb A1c MFr Bld: 4.7 % — ABNORMAL LOW (ref 4.8–5.6)
Mean Plasma Glucose: 88.19 mg/dL

## 2024-05-04 MED ORDER — SERTRALINE HCL 100 MG PO TABS: 100.0000 mg | ORAL_TABLET | Freq: Every evening | ORAL | Status: DC

## 2024-05-04 MED ORDER — ZONISAMIDE 100 MG PO CAPS
300.0000 mg | ORAL_CAPSULE | Freq: Every day | ORAL | Status: DC
  Administered 2024-05-04 – 2024-05-05 (×2): 300 mg via ORAL
  Filled 2024-05-04 (×2): qty 3

## 2024-05-04 MED ORDER — OLANZAPINE 5 MG PO TBDP: 5.0000 mg | ORAL_TABLET | Freq: Three times a day (TID) | ORAL | Status: DC | PRN

## 2024-05-04 MED ORDER — SERTRALINE HCL 100 MG PO TABS
200.0000 mg | ORAL_TABLET | Freq: Every evening | ORAL | Status: DC
  Administered 2024-05-04: 200 mg via ORAL
  Filled 2024-05-04: qty 2

## 2024-05-04 MED ORDER — LORAZEPAM 2 MG/ML IJ SOLN: 2.0000 mg | INTRAMUSCULAR | Status: DC | PRN

## 2024-05-04 MED ORDER — ALUM & MAG HYDROXIDE-SIMETH 200-200-20 MG/5ML PO SUSP: 30.0000 mL | ORAL | Status: DC | PRN

## 2024-05-04 MED ORDER — TRAZODONE HCL 50 MG PO TABS: 50.0000 mg | ORAL_TABLET | Freq: Every evening | ORAL | Status: DC | PRN

## 2024-05-04 MED ORDER — RISPERIDONE 2 MG PO TABS
2.0000 mg | ORAL_TABLET | Freq: Every day | ORAL | Status: DC
  Administered 2024-05-04: 2 mg via ORAL
  Filled 2024-05-04 (×2): qty 1

## 2024-05-04 MED ORDER — ACETAMINOPHEN 325 MG PO TABS: 650.0000 mg | ORAL_TABLET | Freq: Four times a day (QID) | ORAL | Status: DC | PRN

## 2024-05-04 MED ORDER — SERTRALINE HCL 50 MG PO TABS: 50.0000 mg | ORAL_TABLET | Freq: Every evening | ORAL | Status: DC

## 2024-05-04 MED ORDER — MAGNESIUM HYDROXIDE 400 MG/5ML PO SUSP: 30.0000 mL | Freq: Every day | ORAL | Status: DC | PRN

## 2024-05-04 MED ORDER — BUSPIRONE HCL 15 MG PO TABS
15.0000 mg | ORAL_TABLET | Freq: Two times a day (BID) | ORAL | Status: DC
  Administered 2024-05-04 – 2024-05-05 (×3): 15 mg via ORAL
  Filled 2024-05-04 (×3): qty 1

## 2024-05-04 MED ORDER — HYDROXYZINE HCL 25 MG PO TABS
25.0000 mg | ORAL_TABLET | Freq: Three times a day (TID) | ORAL | Status: DC | PRN
  Administered 2024-05-04: 25 mg via ORAL
  Filled 2024-05-04: qty 1

## 2024-05-04 MED ORDER — OLANZAPINE 10 MG IM SOLR: 5.0000 mg | Freq: Three times a day (TID) | INTRAMUSCULAR | Status: DC | PRN

## 2024-05-04 MED ORDER — OLANZAPINE 10 MG IM SOLR: 10.0000 mg | Freq: Three times a day (TID) | INTRAMUSCULAR | Status: DC | PRN

## 2024-05-04 NOTE — BHH Counselor (Signed)
 Adult Comprehensive Assessment  Patient ID: Lindsay Rosario, female   DOB: 28-Sep-2001, 23 y.o.   MRN: [id number]  Information Source: Information source: Patient  Current Stressors:  Patient states their primary concerns and needs for treatment are:: thoughts of suicide for two weeks Patient states their goals for this hospitilization and ongoing recovery are:: I don't want anymore suicidal thoughts Educational / Learning stressors: None reported Employment / Job issues: None reported Family Relationships: None reported Surveyor, quantity / Lack of resources (include bankruptcy): None reported Housing / Lack of housing: None reported Physical health (include injuries & life threatening diseases): None reported Social relationships: None reported Substance abuse: None reported Bereavement / Loss: None reported  Living/Environment/Situation:  Living Arrangements: Parent Living conditions (as described by patient or guardian): me mom and brother Who else lives in the home?: me mom and brother How long has patient lived in current situation?: 5 years What is atmosphere in current home: Loving, Supportive  Family History:  Marital status: Single Are you sexually active?: No What is your sexual orientation?: Heterosexual Has your sexual activity been affected by drugs, alcohol, medication, or emotional stress?: No  Childhood History:  Additional childhood history information: None reported Description of patient's relationship with caregiver when they were a child: it was good Patient's description of current relationship with people who raised him/her: it's currently good. How were you disciplined when you got in trouble as a child/adolescent?: I got spanked Does patient have siblings?: Yes Number of Siblings: 3 Description of patient's current relationship with siblings: good relaitinship, we all get along. 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 [employer] Where was the Patient Employed at that Time?: 1 year  Architect:   Surveyor, quantity resources: Income from employment Does patient have a representative payee or guardian?: No  Alcohol/Substance Abuse:   What has been your use of drugs/alcohol within the last 12 months?: None reported If attempted suicide, did drugs/alcohol play a role in this?: No Alcohol/Substance Abuse Treatment Hx: Denies past history If yes, describe treatment: N/A Has alcohol/substance abuse ever caused legal problems?: No  Social Support System:   Conservation officer, nature Support System: Fair Museum/gallery exhibitions officer System: my mom, friends, and other famly Type of faith/religion: Christian How does patient's faith help to cope with current illness?: I don't know  Leisure/Recreation: N/A    Strengths/Needs:   What is the patient's perception of their strengths?: I'm good at taking care of my cat, my persevereance Patient states they can use these personal strengths during their treatment to contribute to their recovery: I don't know Patient states these barriers may affect/interfere with their treatment: None reported Patient states these barriers may affect their return to the community: None reported Other important information patient would like considered in planning for their treatment: N/A  Discharge Plan:   Currently receiving community mental health services: Yes (From Whom) [facility] for therapy and [facility] for med  mgmt) Patient states concerns and preferences for aftercare planning are: Going home Patient states they will know when  they are safe and ready for discharge when: They'll tell me I'm ready Does patient have access to transportation?: Yes Does patient have financial barriers related to discharge medications?: No Patient description of barriers related to discharge medications: None reported Will patient be returning to same living situation after discharge?: Yes  Summary/Recommendations:   Summary and Recommendations (to be completed by the evaluator): Lindsay Rosario is a 23 year old female who was voluntarily admitted from [facility] ED at [facility] due to suicidal thoughts with multiple plans to end her life. Patient denied having plans to end her life upon assessment. She reported a stressor includes her anxiety, as she sometimes has difficulty utilizing coping skills. Patient reported having adequate support including her mother, brother, and family members. Patient denied the use of illicit, mood-altering substances including the consumption of alcohol. Patient's urinary drug screen was negative for all illicit, mood-altering substances. Upon assessment patient presented as calm and engaging. She endorsed currently seeing a therapist at [facility] and receiving medication management at [facility].  Lindsay Rosario. 05/04/2024

## 2024-05-04 NOTE — BHH Group Notes (Signed)
 BHH Group Notes:  (Nursing/MHT/Case Management/Adjunct)  Date:  05/04/2024  Time:  0830am  Type of Therapy:  The focus of this group is to help patients establish daily goals to achieve during treatment and discuss how the patient can incorporate goal setting into their daily lives to aTide in recovery.  Participation Level:  Active  Participation Quality:  Appropriate  Affect:  Appropriate  Cognitive:  Alert and Appropriate  Insight:  Appropriate and Good  Engagement in Group:  Engaged  Modes of Intervention:  Discussion  Summary of Progress/Problems: Pt state her goal were to stay postive to lower her anxiety.  Cassius LOISE Dawn 05/04/2024, 9:56 AM

## 2024-05-04 NOTE — Progress Notes (Signed)
 Patient rated her anxiety level 8/10 and her depression level 10/10 with 10 being the highest and 0 none. Patient identified her goal for today as, No suicidal thoughts lower anxiety. Medication and group compliant. Minimal interaction observed with peers. Appetite good on shift. Safety maintained.  05/04/24 0915  Psych Admission Type (Psych Patients Only)  Admission Status Voluntary  Psychosocial Assessment  Patient Complaints Anxiety;Depression;Irritability  Eye Contact Fair  Facial Expression Anxious  Affect Anxious;Depressed;Irritable  Speech Logical/coherent  Interaction Assertive  Motor Activity Other (Comment) (wnl)  Appearance/Hygiene Unremarkable  Behavior Characteristics Anxious  Mood Anxious;Depressed;Irritable;Preoccupied  Thought Process  Coherency WDL  Content WDL  Delusions None reported or observed  Perception WDL  Hallucination None reported or observed  Judgment WDL  Confusion None  Danger to Self  Current suicidal ideation? Passive  Agreement Not to Harm Self Yes  Description of Agreement Verbal  Danger to Others  Danger to Others None reported or observed
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
## 2024-05-04 NOTE — [facility] Suicide Risk Assessment (Addendum)
 Suicide Risk Assessment  Admission Assessment    [facility] Admission Suicide Risk Assessment   Nursing information obtained from:  Patient Demographic factors:  Adolescent or young adult Current Mental Status:  Suicidal ideation indicated by patient Loss Factors:  NA Historical Factors:  Impulsivity Risk Reduction Factors:  Sense of responsibility to family, Employed, Living with another person, especially a relative, Positive social support  Total Time spent with patient: 45 minutes Principal Problem:   GAD (generalized anxiety disorder)   Bipolar disorder (HCC)   OCD (obsessive compulsive disorder) Diagnosis:  Principal Problem:   Bipolar disorder (HCC) Active Problems:   GAD (generalized anxiety disorder)   OCD (obsessive compulsive disorder)   Epilepsy, generalized, convulsive (HCC)  Subjective Data:   Lindsay Rosario is a 23 y.o., female with a past psychiatric history significant for Bipolar Disorder (HCC), OCD, GAD, and MDD who presents to the [facility] Voluntary from [facility] Emergency Department. The patient initially sought care for medication titration due to overwhelming suicidal ideation (SI); however, after disclosing three distinct plans for suicide, patient was admitted for inpatient psychiatric care.    Initial assessment on 07/03, the patient reports that she has felt a low mood for quite some time, but the overwhelming thoughts of suicide have been really bad these past two weeks. Sometimes it feels like I don't have a purpose, that its not worth it. 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 I don't agree with these thoughts.    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 She is currently prescribed Sertraline  50 mg tablets for her depression,  but is unsure if dosages are appropriate. She denies medication nonadherence. No current substance use reported. Patient expresses ambivalence towards suicidal thoughts, denies current intent to act, and identifies reasons for living.    Continued Clinical Symptoms:  Alcohol Use Disorder Identification Test Final Score (AUDIT): 0 The Alcohol Use Disorders Identification Test, Guidelines for Use in Primary Care, Second Edition.  World Science writer [facility]). Score between 0-7:  no or low risk or alcohol related problems. Score between 8-15:  moderate risk of alcohol related problems. Score between 16-19:  high risk of alcohol related problems. Score 20 or above:  warrants further diagnostic evaluation for alcohol dependence and treatment.   CLINICAL FACTORS:   Severe Anxiety and/or Agitation Bipolar Disorder:   Depressive phase Obsessive-Compulsive Disorder Epilepsy Previous Psychiatric Diagnoses and Treatments   Musculoskeletal: Strength & Muscle Tone: within normal limits Gait & Station: normal Patient leans: N/A  Psychiatric Specialty Exam:  Presentation  General Appearance: Appropriate for Environment  Eye Contact:Good  Speech:Clear and Coherent; Normal Rate  Speech Volume:Normal  Handedness:No data recorded  Mood and Affect  Mood:Depressed  Affect:Appropriate   Thought Process  Thought Processes:Coherent  Descriptions of Associations:No data recorded Orientation:Full (Time, Place and Person)  Thought Content:WDL  History of Schizophrenia/Schizoaffective disorder:No  Hallucinations:Hallucinations: None  Ideas of Reference:None  Suicidal Thoughts:Suicidal Thoughts: Yes, Active SI Active Intent and/or Plan: Without Intent; Without Plan  Homicidal Thoughts:Homicidal Thoughts: No   Sensorium  Memory:Immediate Good; Recent Good; Remote Good  Judgment:Fair  Insight:Fair   Executive Functions  Concentration:Good  Attention  Span:Good  Recall:Good  Fund of Knowledge:Good  Language:Good   Psychomotor Activity  Psychomotor Activity:Psychomotor Activity: Normal   Assets  Assets:Housing; Desire  for Improvement   Sleep  Sleep:Sleep: Poor Number of Hours of Sleep: 0.75    Physical Exam: Physical Exam Constitutional:      Appearance: Normal appearance. She is normal weight.  HENT:     Head: Atraumatic.  Pulmonary:     Effort: Pulmonary effort is normal.  Neurological:     Mental Status: She is alert.    ROS Blood pressure 104/73, pulse 94, temperature 98.3 F (36.8 C), temperature source Oral, resp. rate 17, height 5' 5 (1.651 m), weight 56.2 kg, last menstrual period 05/04/2024, SpO2 100%. Body mass index is 20.63 kg/m.   COGNITIVE FEATURES THAT CONTRIBUTE TO RISK:  None    SUICIDE RISK:   Severe:  Frequent, intense, and enduring suicidal ideation, specific plan, no subjective intent, but some objective markers of intent (i.e., choice of lethal method), the method is accessible, some limited preparatory behavior, evidence of impaired self-control, severe dysphoria/symptomatology, multiple risk factors present, and few if any protective factors, particularly a lack of social support.  PLAN OF CARE:   Assessment:  Lindsay Rosario is a 23 y.o., female with a past psychiatric history significant for Bipolar Disorder, OCD, and GAD who presents to the [facility] Voluntary from [facility] Emergency Department. The patient initially sought care for medication titration due to overwhelming suicidal ideation (SI); however, after disclosing three distinct plans for suicide, patient was admitted for inpatient psychiatric care.    Differential diagnosis: Bipolar Disorder, depressive episode OCD GAD   PLAN:   Treatment Plan: Continue Buspirone  tablet 15 mg bid for anxiety Continue Risperidone  tablet 2 mg at bedtime for Bipolar Disorder Increase dosage of Sertraline  tablet 50  mg to Sertaline tablet 200 mg every evening for depressive symptoms Start Trazodone 50 mg at bedtime as needed for insomnia Start Hydroxyzine 25 mg TID as needed for anxiety Agitation Protocol: Olanzapine injection 10 mg, Olanzapine injection 5 mg, Olanzapine disintegrating tablets 5 mg Continue Zonisamide  capsule 300 mg daily for seizures Start Lorazepam injection 2 mg every 4 hours prn for abortive seizures lasting more than 5 minutes   The risks/benefits/side-effects/alternatives to the above medication were discussed in detail with the patient and time was given for questions. The patient consents to medication trial. FDA black box warnings, if present, were discussed.   The patient is agreeable with the medication plan, as above. We will monitor the patient's response to pharmacologic treatment, and adjust medications as necessary.   Safety and Monitoring:             -- Voluntary admission to inpatient psychiatric unit for safety, stabilization and treatment             -- Daily contact with patient to assess and evaluate symptoms and progress in treatment             -- Patient's case to be discussed in multi-disciplinary team meeting             -- Observation Level : q15 minute checks             -- Vital signs:  q12 hours             -- Precautions: suicide, elopement, and assault   3. Labs:                      -- Order Lipid panel and Hb A1c to monitor since on atypical antipsychotic which can increase chances of metabolic disorder   4. Discharge  Planning:              -- Social work and case management to assist with discharge planning and identification of hospital follow-up needs prior to discharge             -- Estimated LOS: 5-7 days             -- Discharge Concerns: Need to establish a safety plan; Medication compliance and effectiveness             -- Discharge Goals: Return home with outpatient referrals for mental health follow-up including  medication management/psychotherapy    I certify that inpatient services furnished can reasonably be expected to improve the patient's condition.   Alan Maiden, MD 05/04/2024, 2:32 PM

## 2024-05-04 NOTE — Plan of Care (Signed)
  Problem: Education: Goal: Knowledge of [facility] Education information/materials will improve Outcome: Progressing Goal: Mental status will improve Outcome: Progressing Goal: Verbalization of understanding the information provided will improve Outcome: Progressing   Problem: Education: Goal: Emotional status will improve Outcome: Not Progressing

## 2024-05-04 NOTE — Plan of Care (Signed)
 ?  Problem: Activity: ?Goal: Interest or engagement in activities will improve ?Outcome: Progressing ?Goal: Sleeping patterns will improve ?Outcome: Progressing ?  ?Problem: Coping: ?Goal: Ability to verbalize frustrations and anger appropriately will improve ?Outcome: Progressing ?Goal: Ability to demonstrate self-control will improve ?Outcome: Progressing ?  ?Problem: Safety: ?Goal: Periods of time without injury will increase ?Outcome: Progressing ?  ?

## 2024-05-04 NOTE — Group Note (Signed)
 Date:  05/04/2024 Time:  10:07 PM  Group Topic/Focus:  Wrap-Up Group:   The focus of this group is to help patients review their daily goal of treatment and discuss progress on daily workbooks.    Additional Comments:   Pt was encouraged, but opted out of attending wrap up group this evening.   Lindsay Rosario 05/04/2024, 10:07 PM

## 2024-05-04 NOTE — Group Note (Signed)
 LCSW Group Therapy Note   Group Date: 05/04/2024 Start Time: 1100 End Time: 1200   Participation:  patient was present  Type of Therapy:  Group Therapy  Title:  From "One Day" to "Today is Day One": Begin Your Journey to Better Health and Mental Well-Being  Objective:  To educate participants on the importance of routine, sleep, diet, and movement for improving mental health and overall well-being. Encourage goal-setting for small, achievable changes.  3 Goals: Encourage participants to set one small, achievable goal related to sleep, diet, or movement for the coming week. Promote self-awareness by discussing the connection between physical and mental health. Support accountability by having participants share goals with the group.  Summary: Participants engaged in a discussion about lifestyle factors influencing mental health, including routines, sleep, nutrition, and movement. They set personal goals for the week to improve well-being and shared their goals for accountability.  Therapeutic Modalities: Cognitive Behavioral Therapy (CBT) for exploring the relationship between thoughts, feelings, and behaviors. Psychoeducation on lifestyle changes and their impact on mental health. Goal-setting to promote empowerment and motivation.   Lindsay Rosario O Lindsay Rosario, LCSWA 05/04/2024  8:13 PM

## 2024-05-04 NOTE — [facility] Suicide Risk Assessment (Signed)
 [facility] INPATIENT:  Family/Significant Other Suicide Prevention Education  Suicide Prevention Education:  Education Completed; Lindsay Rosario (mother) [phone],  (name of family member/significant other) has been identified by the patient as the family member/significant other with whom the patient will be residing, and identified as the person(s) who will aid the patient in the event of a mental health crisis (suicidal ideations/suicide attempt).  With written consent from the patient, the family member/significant other has been provided the following suicide prevention education, prior to the and/or following the discharge of the patient.  Lindsay confirmed patient will not have access to firearms/guns/lethal medications to harm herself or others. Lindsay confirmed she'd be willing to assist the patient with safely storing and securing her medications. Lindsay reported she has no safety concerns regarding patient discharging home once stable.     The suicide prevention education provided includes the following: Suicide risk factors Suicide prevention and interventions National Suicide Hotline telephone number [facility] assessment telephone number [facility] Emergency Assistance 911 [facility] and/or Residential Mobile Crisis Unit telephone number  Request made of family/significant other to: Remove weapons (e.g., guns, rifles, knives), all items previously/currently identified as safety concern.   Remove drugs/medications (over-the-counter, prescriptions, illicit drugs), all items previously/currently identified as a safety concern.  The family member/significant other verbalizes understanding of the suicide prevention education information provided.  The family member/significant other agrees to remove the items of safety concern listed above.  Lindsay Rosario M Obie Silos, LCSWA 05/04/2024, 3:26 PM

## 2024-05-04 NOTE — Tx Team (Signed)
 Initial Treatment Plan 05/04/2024 5:47 AM Lindsay Rosario FMW:[id number]    PATIENT STRESSORS: Other: Overwhelmed with life stressors     PATIENT STRENGTHS: Ability for insight  Capable of independent living  Arboriculturist fund of knowledge  Motivation for treatment/growth  Supportive family/friends  Work skills    PATIENT IDENTIFIED PROBLEMS: Ineffective Coping Skills  Stress Management                   DISCHARGE CRITERIA:  Ability to meet basic life and health needs Improved stabilization in mood, thinking, and/or behavior Verbal commitment to aftercare and medication compliance  PRELIMINARY DISCHARGE PLAN: Outpatient therapy Return to previous living arrangement Return to previous work or school arrangements  PATIENT/FAMILY INVOLVEMENT: This treatment plan has been presented to and reviewed with the patient, Lindsay Rosario.  The patient and family have been given the opportunity to ask questions and make suggestions.  Leonette KATHEE Fischer, RN 05/04/2024, 5:47 AM

## 2024-05-04 NOTE — Group Note (Unsigned)
 Date:  05/04/2024 Time:  9:11 AM  Group Topic/Focus:  Goals Group:   The focus of this group is to help patients establish daily goals to achieve during treatment and discuss how the patient can incorporate goal setting into their daily lives to aide in recovery.     Participation Level:  {B[facility] PARTICIPATION OZCZO:77735}  Participation Quality:  {[facility] PARTICIPATION QUALITY:22265}  Affect:  {[facility] AFFECT:22266}  Cognitive:  {[facility] COGNITIVE:22267}  Insight: {BHH Insight2:20797}  Engagement in Group:  {B[facility] ENGAGEMENT IN HMNLE:77731}  Modes of Intervention:  {B[facility] MODES OF INTERVENTION:22269}  Additional Comments:  ***  Lindsay Rosario 05/04/2024, 9:11 AM

## 2024-05-04 NOTE — Progress Notes (Addendum)
 Patient ID: Lindsay Rosario, female   DOB: [birth year], 23 y.o.   MRN: [id number]  Patient admitted voluntarily to [facility] from [facility]. She presented with increased depression, SI with various plans (drive car into traffic, take all her meds, or jump off bridge). Patient A/O x 4, ambulatory. She denies HI/AVH. Patient has passive SI and contracts for safety. She is tearful upon assessment. She states she has a job (works for [employer]), has a Development worker, international aid., so she does not understand why she feels they way she does at times. Patient states she came to the ER to have her medications titrated, hoping she would feel better. Admission process complete/consent forms signed. Patient verbalized understanding. Skin assessment/non invasive skin search performed; skin intact. Tattoo noted to left thigh. In addition, nose and belly piercing noted. Patient offered meal/po fluids; she declined. She was oriented to the unit/patient room. Patient safety maintained.

## 2024-05-05 MED ORDER — HYDROXYZINE HCL 25 MG PO TABS
25.0000 mg | ORAL_TABLET | Freq: Three times a day (TID) | ORAL | 0 refills | Status: DC | PRN
  Filled 2024-05-05: qty 14, 5d supply, fill #0

## 2024-05-05 MED ORDER — SERTRALINE HCL 100 MG PO TABS
100.0000 mg | ORAL_TABLET | Freq: Every evening | ORAL | 0 refills | Status: DC
  Filled 2024-05-05: qty 14, 14d supply, fill #0

## 2024-05-05 NOTE — Progress Notes (Signed)
 CSW called and schedule Safe Transport for patient pickup for today, 05/05/2024 at 11:30am. Pt will be transported to her vehicle located at [place] [facility] Parking lot - [address].   Lin Hackmann [place], [place] 05/05/2024 9:34am

## 2024-05-05 NOTE — Discharge Summary (Signed)
 Physician Discharge Summary Note  Patient:  Lindsay Rosario is an 23 y.o., female MRN:  [id number] DOB:  Jun 08, 2001 Patient phone:  [phone] (home)  Patient address:   [address] [place] [address],  Total Time spent with patient: 20 minutes  Date of Admission:  05/04/2024 Date of Discharge: 05/04/2024  Reason for Admission:   Lindsay Rosario is a 23 y.o., female with a past psychiatric history significant for Bipolar Disorder (HCC), OCD, and GAD who presents to the [facility] Voluntary from [facility] Emergency Department. The patient initially sought care for medication titration due to overwhelming suicidal ideation (SI); however, after disclosing three distinct plans for suicide, patient was admitted for inpatient psychiatric care.   Principal Problem:  Bipolar 2 disorder (HCC) Discharge Diagnoses:  Principal Problem:   Bipolar 2 disorder (HCC) Active Problems:   GAD (generalized anxiety disorder)   OCD (obsessive compulsive disorder)   Epilepsy, generalized, convulsive (HCC)   Past Psychiatric History:  Previous psychiatric diagnoses: Bipolar Disorder, GAD, OCD, and MDD Prior psychiatric treatment: Risperidone  1 mg, Sertraline  25 mg Psychiatric medication compliance history: Adherent   Current psychiatric treatment: Risperidone  2 mg, Sertraline  25 mg, Buspirone  15 mg Current psychiatrist: Zane Bach, NP   Previous hospitalizations: NA History of suicide attempts: Denies History of self harm: Denies  Past Medical History:  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   Routine general medical examination at a health care facility 06/25/2023   Seizures [facility])     Past Surgical History:  Procedure Laterality Date   MOUTH SURGERY     wisdom  teeth removed x 2   Family History:  Family History  Problem Relation Age of Onset   Migraines Mother    Migraines Brother    Irritable bowel syndrome Maternal Aunt    Colon cancer Neg Hx    Esophageal cancer Neg Hx    Family Psychiatric  History:  Mother, Maternal Aunt, and Maternal Grandmother have Bipolar Disorder. Substance abuse in mother   Social History:  Social History   Substance and Sexual Activity  Alcohol Use No     Social History   Substance and Sexual Activity  Drug Use Never    Social History   Socioeconomic History   Marital status: Single    Spouse name: Not on file   Number of children: 0   Years of education: Not on file   Highest education level: 12th grade  Occupational History   Occupation: [employer]    Comment: Administrator  Tobacco Use   Smoking status: Never   Smokeless tobacco: Never  Vaping Use   Vaping status: Never Used  Substance and Sexual Activity   Alcohol use: No   Drug use: Never   Sexual activity: Yes    Birth control/protection: Inserts  Other Topics Concern   Not on file  Social History Narrative   [name]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 She does well in school.   Lives with her grandparents   Social Drivers of Health   Financial Resource Strain: Medium Risk (11/20/2022)   Overall Financial Resource Strain (CARDIA)    Difficulty of Paying Living Expenses: Somewhat hard  Food Insecurity: Food Insecurity Present (05/04/2024)  Hunger Vital Sign    Worried About Running Out of Food in the Last Year: Sometimes true    Ran Out of Food in the Last Year: Never true  Transportation Needs: No Transportation Needs (05/04/2024)   PRAPARE - Administrator, Civil Service (Medical): No    Lack of Transportation (Non-Medical): No  Physical Activity: Unknown (11/20/2022)   Exercise Vital Sign    Days of Exercise per Week: 0 days    Minutes of Exercise per  Session: Not on file  Stress: Stress Concern Present (11/20/2022)   [employer] of Occupational Health - Occupational Stress Questionnaire    Feeling of Stress : Very much  Social Connections: Moderately Integrated (11/20/2022)   Social Connection and Isolation Panel    Frequency of Communication with Friends and Family: More than three times a week    Frequency of Social Gatherings with Friends and Family: Twice a week    Attends Religious Services: 1 to 4 times per year    Active Member of [employer] or Organizations: No    Attends Engineer, structural: Not on file    Marital Status: Living with partner    Hospital Course:   During the patient's hospitalization, patient had extensive initial psychiatric evaluation, and follow-up psychiatric evaluations every day.   Psychiatric diagnoses provided upon initial assessment: Principal Problem:   Bipolar 2 disorder (HCC) Active Problems:   GAD (generalized anxiety disorder)   OCD (obsessive compulsive disorder)   Epilepsy, generalized, convulsive (HCC)    Patient's psychiatric medications were adjusted on admission:  Continue Buspirone  tablet 15 mg bid for anxiety Continue Risperidone  tablet 2 mg at bedtime for Bipolar Disorder Start Trazodone  50 mg at bedtime as needed for insomnia Start Hydroxyzine  25 mg TID as needed for anxiety Agitation Protocol: Olanzapine  injection 10 mg, Olanzapine  injection 5 mg, Olanzapine  disintegrating tablets 5 mg Continue Zonisamide  capsule 300 mg daily for seizures Start Lorazepam  injection 2 mg every 4 hours prn for abortive seizures lasting more than 5 minutes   During the hospitalization, other adjustments were made to the patient's psychiatric medication regimen:  Increase dosage of Sertraline  tablet 50 mg to Sertaline tablet 100 mg every evening for depressive symptoms   Patient's care was discussed during the interdisciplinary team meeting every day during the hospitalization.   The  patient denied having side effects to prescribed psychiatric medication.   Gradually, patient started adjusting to milieu. The patient was evaluated each day by a clinical provider to ascertain response to treatment. Improvement was noted by the patient's report of decreasing symptoms, improved sleep and appetite, affect, medication tolerance, behavior, and participation in unit programming.  Patient was asked each day to complete a self inventory noting mood, mental status, pain, new symptoms, anxiety and concerns.     Symptoms were reported as significantly decreased or resolved completely by discharge.    On day of discharge, the patient reports that their mood is stable. The patient denied having suicidal thoughts for more than 48 hours prior to discharge.  Patient denies having homicidal thoughts.  Patient denies having auditory hallucinations.  Patient denies any visual hallucinations or other symptoms of psychosis. The patient was motivated to continue taking medication with a goal of continued improvement in mental health.    The patient reports their target psychiatric symptoms of anxiety and suicidal ideation responded well to the psychiatric medications, and the patient reports overall benefit other psychiatric hospitalization. Supportive psychotherapy was provided to the patient. The patient  also participated in regular group therapy while hospitalized. Coping skills, problem solving as well as relaxation therapies were also part of the unit programming.   Labs were reviewed with the patient, and abnormal results were discussed with the patient.   The patient is able to verbalize their individual safety plan to this provider.   # It is recommended to the patient to continue psychiatric medications as prescribed, after discharge from the hospital.     # It is recommended to the patient to follow up with your outpatient psychiatric provider and PCP.   # It was discussed with the patient,  the impact of alcohol, drugs, tobacco have been there overall psychiatric and medical wellbeing, and total abstinence from substance use was recommended the patient.ed.   # Prescriptions provided or sent directly to preferred pharmacy at discharge. Patient agreeable to plan. Given opportunity to ask questions. Appears to feel comfortable with discharge.    # In the event of worsening symptoms, the patient is instructed to call the crisis hotline, 911 and or go to the nearest ED for appropriate evaluation and treatment of symptoms. To follow-up with primary care provider for other medical issues, concerns and or health care needs   # Patient was discharged 05/05/2024 with a plan to follow up as noted below.   Physical Findings: AIMS: NA CIWA:  NA COWS:  NA  Musculoskeletal: Strength & Muscle Tone: within normal limits Gait & Station: normal Patient leans: N/A   Psychiatric Specialty Exam:  Mental Status Exam: General Appearance: Casual and Well Groomed  Orientation:  Full (Time, Place, and Person)  Memory:  Grossly intact  Concentration:  Good  Attention  Good  Eye Contact:  Good  Speech:  Clear and Coherent and Normal Rate  Language:  Good  Volume:  Normal  Mood: I feel good  Affect:  Anxious  Thought Process:  Coherent  Thought Content:  Ego-dystonic, intrusive thoughts stating what if I just died?. Patient recognizes thoughts and does not want to have them. Denies intent and plan. Denies HI, hallucinations  Suicidal Thoughts:  Yes. Without intent/plan  Homicidal Thoughts:  No  Judgement:  Good/intact  Insight:  Fair  Psychomotor Activity:  Normal  Akathisia:  Negative  Fund of Knowledge:  Good    Assets:  Desire for Improvement Financial Resources/Insurance Housing  Cognition:  WNL  ADL's:  Intact     Physical Exam: Constitutional:      Appearance: Normal appearance. She is normal weight.  HENT:     Head: Atraumatic.  Pulmonary:     Effort: Pulmonary  effort is normal.  Neurological:     Mental Status: She is alert.  ROS Blood pressure 111/66, pulse (!) 106, temperature 98.5 F (36.9 C), temperature source Oral, resp. rate 18, height 5' 5 (1.651 m), weight 56.2 kg, last menstrual period 05/04/2024, SpO2 100%. Body mass index is 20.63 kg/m.   Social History   Tobacco Use  Smoking Status Never  Smokeless Tobacco Never   Tobacco Cessation:  N/A, patient does not currently use tobacco products   Blood Alcohol level:  Lab Results  Component Value Date   [facility] <15 05/03/2024    Metabolic Disorder Labs:  Lab Results  Component Value Date   HGBA1C 4.7 (L) 05/04/2024   MPG 88.19 05/04/2024   No results found for: PROLACTIN Lab Results  Component Value Date   CHOL 196 05/04/2024   TRIG 62 05/04/2024   HDL 99 05/04/2024   CHOLHDL 2.0 05/04/2024  VLDL 12 05/04/2024   LDLCALC 85 05/04/2024    See Psychiatric Specialty Exam and Suicide Risk Assessment completed by Attending Physician prior to discharge.  Discharge destination:  Home  Is patient on multiple antipsychotic therapies at discharge:  No   Has Patient had three or more failed trials of antipsychotic monotherapy by history:  No  Recommended Plan for Multiple Antipsychotic Therapies: NA   Allergies as of 05/05/2024       Reactions   Kiwi Extract    Lip goes numb      Med Rec must be completed prior to using this [facility]***        Follow-up Information     [facility]. Go on 05/16/2024.   Specialty: Behavioral Health Why: You have an appointment on 05/16/25 at 4:00 pm for medication management services.  You also have an appointment on 07/10/24 at 11:00 am for therapy services. Contact information: [address] [place]  [id number] [phone]                Follow-up recommendations:   Activity: as tolerated  Diet: heart healthy  Other: -Follow-up with your outpatient psychiatric provider  -instructions on appointment date, time, and address (location) are provided to you in discharge paperwork.  -Take your psychiatric medications as prescribed at discharge - instructions are provided to you in the discharge paperwork  -Follow-up with outpatient primary care doctor and other specialists -for management of chronic medical disease, including: Epilepsy  -Testing: Follow-up with outpatient provider for abnormal lab results: N/A  -Recommend abstinence from alcohol, tobacco, and other illicit drug use at discharge.   -If your psychiatric symptoms recur, worsen, or if you have side effects to your psychiatric medications, call your outpatient psychiatric provider, 911, [phone] or go to the nearest emergency department.  -If suicidal thoughts recur, call your outpatient psychiatric provider, 911, [phone] or go to the nearest emergency department.   Comments:    Signed: Dr. Alan Maiden, MD PGY-1, Psychiatry Residency  05/05/2024, 8:43 AM

## 2024-05-05 NOTE — Progress Notes (Signed)
   05/05/24 0908  Psych Admission Type (Psych Patients Only)  Admission Status Voluntary  Psychosocial Assessment  Patient Complaints Anxiety;Depression  Eye Contact Fair  Facial Expression Flat  Affect Appropriate to circumstance  Speech Logical/coherent  Interaction Assertive  Motor Activity Other (Comment) (WDL)  Appearance/Hygiene Unremarkable  Behavior Characteristics Cooperative;Appropriate to situation  Mood Pleasant  Thought Process  Coherency WDL  Content WDL  Delusions None reported or observed  Perception WDL  Hallucination None reported or observed  Judgment Impaired  Confusion None  Danger to Self  Current suicidal ideation? Denies  Description of Suicide Plan No plan  Self-Injurious Behavior No self-injurious ideation or behavior indicators observed or expressed   Agreement Not to Harm Self Yes  Description of Agreement Verbal  Danger to Others  Danger to Others None reported or observed

## 2024-05-05 NOTE — Progress Notes (Signed)
  [facility] Adult Case Management Discharge Plan :  Will you be returning to the same living situation after discharge:  Yes,  pt will be returning home after discharge. At discharge, do you have transportation home?: Yes,  pt will be transported via Safe Transport to her vehicle at [facility] ED Parking lot. Do you have the ability to pay for your medications: Yes,  pt is employed and insured - [employer].  Release of information consent forms completed and in the chart;  Patient's signature needed at discharge.  Patient to Follow up at:  Follow-up Information     [facility]. Go on 05/16/2024.   Specialty: Behavioral Health Why: You have an appointment on 05/16/25 at 4:00 pm for medication management services.  You also have an appointment on 07/10/24 at 11:00 am for therapy services. Contact information: [address] [place]  [address] [phone]                Next level of care provider has access to [facility] Link:no  Safety Planning and Suicide Prevention discussed: Yes,  completed with Corean Herring (mother) [phone].     Has patient been referred to the Quitline?: Patient does not use tobacco/nicotine products  Patient has been referred for addiction treatment: No known substance use disorder.  Dredyn Gubbels M Titianna Loomis, LCSWA 05/05/2024, 8:59 AM

## 2024-05-05 NOTE — Group Note (Signed)
 Date:  05/05/2024 Time:  9:36 AM  Group Topic/Focus:  Goals Group:   The focus of this group is to help patients establish daily goals to achieve during treatment and discuss how the patient can incorporate goal setting into their daily lives to aide in recovery. Orientation:   The focus of this group is to educate the patient on the purpose and policies of crisis stabilization and provide a format to answer questions about their admission.  The group details unit policies and expectations of patients while admitted.    Participation Level:  Did Not Attend   Lindsay Rosario 05/05/2024, 9:36 AM

## 2024-05-05 NOTE — Progress Notes (Signed)
 Patient discharged from [facility] on 05/05/24. Patient denies SI, plan, and intention. Suicide safety plan completed, reviewed with this RN, given to the patient, and a copy in the chart. Patient denies HI/AVH upon discharge. Patient is alert, oriented, and cooperative. RN provided patient with discharge paperwork and reviewed information with patient. Patient expressed that she understood all of the discharge instructions. Pt was satisfied with belongings returned to her from the locker and at bedside. Discharged patient to [facility] waiting room.

## 2024-05-05 NOTE — Plan of Care (Signed)
   Problem: Education: Goal: Emotional status will improve Outcome: Progressing Goal: Mental status will improve Outcome: Progressing   Problem: Activity: Goal: Interest or engagement in activities will improve Outcome: Progressing Goal: Sleeping patterns will improve Outcome: Progressing
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
## 2024-05-05 NOTE — Group Note (Signed)
 Date:  05/05/2024 Time:  1:00 PM  Group Topic/Focus:  Identifying Needs:   The focus of this group is to help patients identify their personal needs that have been historically problematic and identify healthy behaviors to address their needs.    Participation Level:  Minimal  Participation Quality:  Appropriate  Affect:  Appropriate  Cognitive:  Appropriate  Insight: Appropriate  Engagement in Group:  Lacking  Modes of Intervention:  Discussion and Education  Additional Comments:    Arilyn Brierley D [place] 05/05/2024, 1:00 PM

## 2024-05-05 NOTE — Progress Notes (Signed)
   05/04/24 2336  Psych Admission Type (Psych Patients Only)  Admission Status Voluntary  Psychosocial Assessment  Patient Complaints Anxiety  Eye Contact Fair  Facial Expression Anxious  Affect Appropriate to circumstance  Speech Logical/coherent  Interaction Assertive  Motor Activity Other (Comment) (WDL)  Appearance/Hygiene Unremarkable  Behavior Characteristics Cooperative  Mood Pleasant  Thought Process  Coherency WDL  Content WDL  Delusions None reported or observed  Perception WDL  Hallucination None reported or observed  Judgment WDL  Confusion None  Danger to Self  Current suicidal ideation? Passive  Description of Suicide Plan No plan  Agreement Not to Harm Self Yes  Description of Agreement Verbal  Danger to Others  Danger to Others None reported or observed

## 2024-05-05 NOTE — Plan of Care (Signed)
  Problem: Education: Goal: Emotional status will improve Outcome: Progressing Goal: Mental status will improve Outcome: Progressing Goal: Verbalization of understanding the information provided will improve Outcome: Progressing   Problem: Safety: Goal: Periods of time without injury will increase Outcome: Progressing   

## 2024-05-06 ENCOUNTER — Other Ambulatory Visit (HOSPITAL_COMMUNITY): Payer: Self-pay

## 2024-05-08 ENCOUNTER — Other Ambulatory Visit (HOSPITAL_COMMUNITY): Payer: Self-pay

## 2024-05-10 DIAGNOSIS — F428 Other obsessive-compulsive disorder: Secondary | ICD-10-CM | POA: Diagnosis not present

## 2024-05-16 ENCOUNTER — Ambulatory Visit (HOSPITAL_COMMUNITY): Admitting: Psychiatry

## 2024-05-16 ENCOUNTER — Encounter (HOSPITAL_COMMUNITY): Payer: Self-pay | Admitting: Psychiatry

## 2024-05-16 VITALS — BP 111/66 | HR 106 | Ht 65.0 in | Wt 123.0 lb

## 2024-05-16 DIAGNOSIS — F422 Mixed obsessional thoughts and acts: Secondary | ICD-10-CM

## 2024-05-16 DIAGNOSIS — F332 Major depressive disorder, recurrent severe without psychotic features: Secondary | ICD-10-CM

## 2024-05-16 DIAGNOSIS — F3181 Bipolar II disorder: Secondary | ICD-10-CM

## 2024-05-16 NOTE — Progress Notes (Signed)
 BH MD/PA/NP OP Progress Note  Chief Complaint: I'm still anxious.  HPI:  23 year old female seen today for follow-up psychiatric evaluation.  She has a psychiatric history of anxiety and depression.  Currently she is managed on Risperdal  2 mg daily (patient works at night), BuSpar  15 mg twice daily and Zoloft  100 mg daily.    Today she is well-groomed, pleasant, cooperative, and engaged in conversation.  She went to inpatient in June after her intrusive thoughts increased significantly of wanting to end her life even though she she did not want to.  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  She was in the IV room which was sterile with cleaning essential which fed into her OCD.  Work became affected as she was counting things at least 3 times to assure accuracy along with cleaning more than needed.  This triggered the pharmacist to let her know to hurry up which increased her anxiety which increased her compulsions.  Then, her intrusive thoughts of self-harm started.  Since being off of work her symptoms have decreased along with suicidal ideations.  6/10 depression with passive intrusive suicidal ideations at times, no plan or intent, denied suicide attempts or self-harm behaviors.  Her anxiety is also a 6/10 which was less until the past couple of days as she prepares to return to work tomorrow night.  Her last panic attack was inpatient, none since then.  No counting or cleaning issues since hospitalization.  Her sleep is good with her medications, solid 8 hours.  Denies mania symptoms and psychosis.  Appetite is somewhat decreased recently, she has gained 10 pounds since January which was desired.    She lives with her mother and younger brother with supporting the household financially as her mother is getting her GED and her brother works Armed forces operational officer.  Lindsay Rosario desires to return to school for lighting and stage set-up.  Upon returning to work, she hopes to  change her schedule to 2nd shift to make her school hours work.  She is goal oriented and future focused.    Pleasant and engaged easily in conversation.  She has been on Zoloft  for 1.5 years and increased during inpatient.  Discussed Luvox as an option or increasing the Zoloft .  She wants to discuss this with her therapist and decide by her next visit.  Appointment made at the desk.  Patient denies alcohol, tobacco, and illegal drug use.  No medication changes made.  Patient agreeable to continue medication as prescribed.  No other concerns noted at time.   Visit Diagnosis:    ICD-10-CM   1. Mixed obsessional thoughts and acts  F42.2     2. 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 Bipolar 2 disorder (HCC)  F31.81        Past Psychiatric History:  Anxiety and depression   Past Medical History:  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   Routine general medical examination at a health care facility 06/25/2023   Seizures [facility])     Past Surgical History:  Procedure Laterality Date   MOUTH SURGERY     wisdom teeth removed x 2    Family Psychiatric History:  Mother bipolar disorder, maternal grandmother anxiety and bipolar disorder, aunt  bipolar disorder and anxiety   Family History:  Family History  Problem Relation Age of Onset   Migraines Mother    Migraines Brother    Irritable bowel syndrome Maternal Aunt    Colon cancer Neg Hx    Esophageal cancer Neg Hx     Social History:  Social History   Socioeconomic History   Marital status: Single    Spouse name: Not on file   Number of children: 0   Years of education: Not on file   Highest education level: 12th grade  Occupational History   Occupation: [employer]    Comment: Administrator  Tobacco Use    Smoking status: Never   Smokeless tobacco: Never  Vaping Use   Vaping status: Never Used  Substance and Sexual Activity   Alcohol use: No   Drug use: Never   Sexual activity: Yes    Birth control/protection: Inserts  Other Topics Concern   Not on file  Social History Narrative   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 She does well in school.   Lives with her grandparents   Social Drivers of Health   Financial Resource Strain: Medium Risk (11/20/2022)   Overall Financial Resource Strain (CARDIA)    Difficulty of Paying Living Expenses: Somewhat hard  Food Insecurity: Food Insecurity Present (05/04/2024)   Hunger Vital Sign    Worried About Running Out of Food in the Last Year: Sometimes true    Ran Out of Food in the Last Year: Never true  Transportation Needs: No Transportation Needs (05/04/2024)   PRAPARE - Administrator, Civil Service (Medical): No    Lack of Transportation (Non-Medical): No  Physical Activity: Unknown (11/20/2022)   Exercise Vital Sign    Days of Exercise per Week: 0 days    Minutes of Exercise per Session: Not on file  Stress: Stress Concern Present (11/20/2022)   [employer] of Occupational Health - Occupational Stress Questionnaire    Feeling of Stress : Very much  Social Connections: Moderately Integrated (11/20/2022)   Social Connection and Isolation Panel    Frequency of Communication with Friends and Family: More than three times a week    Frequency of Social Gatherings with Friends and Family: Twice a week    Attends Religious Services: 1 to 4 times per year    Active Member of [employer] or Organizations: No    Attends Engineer, structural: Not on file    Marital Status: Living with partner    Allergies:  Allergies  Allergen Reactions   Kiwi Extract     Lip goes numb     Metabolic Disorder Labs: Lab Results  Component Value Date   HGBA1C 4.7 (L) 05/04/2024    MPG 88.19 05/04/2024   No results found for: PROLACTIN Lab Results  Component Value Date   CHOL 196 05/04/2024   TRIG 62 05/04/2024   HDL 99 05/04/2024   CHOLHDL 2.0 05/04/2024   VLDL 12 05/04/2024   LDLCALC 85 05/04/2024   Lab Results  Component Value Date   TSH 2.21 05/19/2022    Therapeutic Level Labs: No results found for: LITHIUM No results found for: VALPROATE No results found for: CBMZ  Current Medications: Current Outpatient Medications  Medication Sig Dispense Refill   busPIRone  (BUSPAR ) 15 MG tablet Take 1 tablet (15 mg total) by mouth 2 (two) times daily. 60 tablet 3   risperiDONE  (RISPERDAL ) 2  MG tablet Take 1 tablet (2 mg total) by mouth daily. 30 tablet 3   sertraline  (ZOLOFT ) 100 MG tablet Take 1 tablet (100 mg total) by mouth every evening. 14 tablet 0   zonisamide  (ZONEGRAN ) 100 MG capsule Take 3 capsules (300 mg total) by mouth daily. 270 capsule 3   No current facility-administered medications for this visit.     Musculoskeletal: Strength & Muscle Tone: within normal limits and telehealth visit Gait & Station: normal, telehealth visit Patient leans: N/A  Psychiatric Specialty Exam: Review of Systems  Psychiatric/Behavioral:  Positive for dysphoric mood. The patient is nervous/anxious.   All other systems reviewed and are negative.   Blood pressure 111/66, pulse (!) 106, height 5' 5 (1.651 m), weight 123 lb (55.8 kg), last menstrual period 05/04/2024.Body mass index is 20.47 kg/m.  General Appearance: Well Groomed  Eye Contact:  Good  Speech:  Clear and Coherent and Normal Rate  Volume:  Normal  Mood:  depressed, anxious  Affect:  Appropiate  Thought Process:  Coherent, Goal Directed, and Linear  Orientation:  Full (Time, Place, and Person)  Thought Content: WDL and Logical   Suicidal Thoughts:  No  Homicidal Thoughts:  No  Memory:  Immediate;   Good Recent;   Good Remote;   Good  Judgement:  Good  Insight:  Good  Psychomotor  Activity:  Normal  Concentration:  Concentration: Good and Attention Span: Good  Recall:  Good  Fund of Knowledge: Good  Language: Good  Akathisia:  No  Handed:  Right  AIMS (if indicated): not done  Assets:  Communication Skills Desire for Improvement Financial Resources/Insurance Housing Leisure Time Physical Health Social Support Talents/Skills Vocational/Educational  ADL's:  Intact  Cognition: WNL  Sleep:  Good   Screenings: AUDIT    Flowsheet Row [facility]  Alcohol Use Disorder Identification Test Final Score (AUDIT) 0   GAD-7    Flowsheet Row Video Visit from 03/31/2024 in [facility] Video Visit from 01/28/2024 in [facility] Office Visit from 11/19/2023 in [facility] Office Visit from 04/14/2022 in [facility] [place] Office Visit from 10[facility]13/2021 in [facility]  Total GAD-7 Score 15 12 15 17 10    PHQ2-9    Flowsheet Row Clinical Support from 05/16/2024 in [facility] Video Visit from 03/31/2024 in [facility] Video Visit from 01/28/2024 in [facility] Office Visit from 11/19/2023 in [facility] Office Visit from 06/23/2022 in [facility] [place]  PHQ-2 Total Score 1 2 4 5 2   PHQ-9 Total Score 4 8 12 21 12    Flowsheet Row Clinical Support from 05/16/2024 in [facility] Admission (Discharged) from [facility] ED from 05/03/2024 in [facility] Emergency Department at [facility]  C-SSRS RISK CATEGORY Low Risk High Risk High Risk     Assessment and Plan: Patient reports that at times she is anxious but reports that she is able to cope  with it.  She also notes that she would like to try Adderall to help manage her concentration.  Provider informed patient that she will first need psychological testing.  She notes that she has had this done and reports that she will have it sent to [facility].  Provider informed patient  that a nonstimulant will be trialed prior to a stimulant.  She endorsed understanding and agreed.  Provider also informed patient that a UDS would be required prior to starting a stimulant.  At this time no medication changes made.  Patient agreeable to continue medication as prescribed. 1. Generalized anxiety disorder  Continue- sertraline  (ZOLOFT ) 50 MG tablet; Take 1 tablet (50 mg total) by mouth daily.  Dispense: 30 tablet; Refill: 3 Continue- busPIRone  (BUSPAR ) 15 MG tablet; Take 1 tablet (15 mg total) by mouth 2 (two) times daily.  Dispense: 60 tablet; Refill: 3  2. Bipolar 2 disorder, major depressive episode (HCC)  Continue- risperiDONE  (RISPERDAL ) 2 MG tablet; Take 1 tablet (2 mg total) by mouth daily.  Dispense: 30 tablet; Refill: 3   Collaboration of Care: Collaboration of Care: Other provider involved in patient's care AEB PCP  Follow-up in August, appointment in place. Sharlot Becker, NP 05/16/2024, 5:07 PM Patient ID: Lindsay Rosario, female   DOB: 23-Aug-2001, 23 y.o.   MRN: [id number]

## 2024-05-17 ENCOUNTER — Other Ambulatory Visit (HOSPITAL_COMMUNITY): Payer: Self-pay

## 2024-05-18 ENCOUNTER — Other Ambulatory Visit (HOSPITAL_COMMUNITY): Payer: Self-pay

## 2024-05-23 ENCOUNTER — Other Ambulatory Visit (HOSPITAL_COMMUNITY): Payer: Self-pay

## 2024-05-25 DIAGNOSIS — F428 Other obsessive-compulsive disorder: Secondary | ICD-10-CM | POA: Diagnosis not present

## 2024-05-29 ENCOUNTER — Telehealth (HOSPITAL_COMMUNITY): Payer: Self-pay | Admitting: Psychiatry

## 2024-05-30 DIAGNOSIS — Z30430 Encounter for insertion of intrauterine contraceptive device: Secondary | ICD-10-CM | POA: Diagnosis not present

## 2024-06-06 ENCOUNTER — Other Ambulatory Visit: Payer: Self-pay

## 2024-06-06 ENCOUNTER — Other Ambulatory Visit (HOSPITAL_COMMUNITY): Payer: Self-pay

## 2024-06-06 ENCOUNTER — Other Ambulatory Visit (HOSPITAL_COMMUNITY): Payer: Self-pay | Admitting: Psychiatry

## 2024-06-06 MED ORDER — FLUVOXAMINE MALEATE 25 MG PO TABS
25.0000 mg | ORAL_TABLET | Freq: Every day | ORAL | 30 refills | Status: DC
  Filled 2024-06-06: qty 30, 30d supply, fill #0

## 2024-06-06 NOTE — Progress Notes (Signed)
 Patient requested to start Luvox  that was discussed in the last session.  Luvox  25 mg daily started with the plan to titrate off the sertraline  onto the Luvox .   Sharlot Becker, PMHNP

## 2024-06-07 ENCOUNTER — Other Ambulatory Visit (HOSPITAL_COMMUNITY): Payer: Self-pay

## 2024-06-07 DIAGNOSIS — F428 Other obsessive-compulsive disorder: Secondary | ICD-10-CM | POA: Diagnosis not present

## 2024-06-09 ENCOUNTER — Other Ambulatory Visit (HOSPITAL_COMMUNITY): Payer: Self-pay

## 2024-06-09 ENCOUNTER — Other Ambulatory Visit (HOSPITAL_COMMUNITY): Payer: Self-pay | Admitting: Psychiatry

## 2024-06-09 MED ORDER — SERTRALINE HCL 100 MG PO TABS
100.0000 mg | ORAL_TABLET | Freq: Every evening | ORAL | 0 refills | Status: DC
  Filled 2024-06-09: qty 14, 14d supply, fill #0

## 2024-06-13 ENCOUNTER — Telehealth (HOSPITAL_COMMUNITY): Admitting: Psychiatry

## 2024-06-21 DIAGNOSIS — F428 Other obsessive-compulsive disorder: Secondary | ICD-10-CM | POA: Diagnosis not present

## 2024-06-26 ENCOUNTER — Telehealth: Payer: Self-pay | Admitting: Family Medicine

## 2024-06-27 ENCOUNTER — Other Ambulatory Visit (HOSPITAL_COMMUNITY): Payer: Self-pay

## 2024-06-27 ENCOUNTER — Encounter (HOSPITAL_COMMUNITY): Payer: Self-pay | Admitting: Psychiatry

## 2024-06-27 ENCOUNTER — Other Ambulatory Visit: Payer: Self-pay

## 2024-06-27 ENCOUNTER — Telehealth (INDEPENDENT_AMBULATORY_CARE_PROVIDER_SITE_OTHER): Admitting: Psychiatry

## 2024-06-27 DIAGNOSIS — F429 Obsessive-compulsive disorder, unspecified: Secondary | ICD-10-CM | POA: Diagnosis not present

## 2024-06-27 DIAGNOSIS — F411 Generalized anxiety disorder: Secondary | ICD-10-CM

## 2024-06-27 DIAGNOSIS — F3181 Bipolar II disorder: Secondary | ICD-10-CM | POA: Diagnosis not present

## 2024-06-27 MED ORDER — FLUVOXAMINE MALEATE 25 MG PO TABS
ORAL_TABLET | ORAL | 0 refills | Status: DC
  Filled 2024-06-27: qty 70, fill #0
  Filled 2024-07-06: qty 70, 19d supply, fill #0

## 2024-06-27 MED ORDER — BUSPIRONE HCL 15 MG PO TABS
15.0000 mg | ORAL_TABLET | Freq: Two times a day (BID) | ORAL | 3 refills | Status: DC
  Filled 2024-06-27 – 2024-07-20 (×2): qty 60, 30d supply, fill #0

## 2024-06-27 MED ORDER — RISPERIDONE 2 MG PO TABS
2.0000 mg | ORAL_TABLET | Freq: Every day | ORAL | 3 refills | Status: DC
  Filled 2024-06-27 – 2024-07-06 (×2): qty 30, 30d supply, fill #0

## 2024-06-27 NOTE — Progress Notes (Signed)
 BH NP OP Progress Note  Virtual Visit via Video Note   I connected with Lindsay Rosario on 06/27/24 at 1:00 PM EDT by a video enabled telemedicine application and verified that I am speaking with the correct person using two identifiers.   Location: Patient: Community Provider: Clinic   I discussed the limitations of evaluation and management by telemedicine and the availability of in person appointments. The patient expressed understanding and agreed to proceed.   I provided 15 minutes of non-face-to-face time during this encounter.  Chief Complaint: Anxiety is better.  HPI:  23 year old female seen today for follow-up psychiatric evaluation.  She has a psychiatric history of anxiety and depression.  Currently she is managed on Risperdal  2 mg at bedtime or daily depending on her shift, BuSpar  15 mg twice daily and Luvox  25 mg daily.    Today she is well-groomed, pleasant, cooperative, and engaged in conversation.  She stated her anxiety is better at a middle level and working hard in therapy with exposure therapy, specifically.  Lindsay Rosario is working on not counting things in 3's or rechecking IV bags at work constantly.  Recently, she moved from 7 nights on and 7 days off to 2nd shift 5 days a week.  It is much busier and she has not had time to recheck IV bags repeatedly.  Second shift, however, is effecting her sleep more so than 3rd shift with sleeping at times up to her preparation for work.  She requested to return to 3rd shift and awaiting a response.  Her depression is mild with no suicidal ideations.  No problems with appetite.  Denies side effects from her medications.  She lives with her mother and younger brother with supporting the household financially as her mother is getting her GED and her brother works Armed forces operational officer.  Lindsay Rosario desires to return to school for lighting and stage set-up.  Her manager is working with her to adjust her schedule in the spring to assist her taking classes.   She is goal oriented and future focused.    Pleasant and engaged easily in conversation.  She misses her friends on 3rd shift and looking forward to seeing them again.  Patient denies alcohol, tobacco, and illegal drug use.   No other concerns noted at time.   Visit Diagnosis:    ICD-10-CM   1. Obsessive-compulsive disorder, unspecified type  F42.9     2. Bipolar 2 disorder, major depressive episode (HCC)  F31.81 risperiDONE  (RISPERDAL ) 2 MG tablet    3. Generalized anxiety disorder  F41.1 busPIRone  (BUSPAR ) 15 MG tablet       Past Psychiatric History:  Anxiety and depression   Past Medical History:  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   Routine general medical examination at a health care facility 06/25/2023   Seizures [facility])     Past Surgical History:  Procedure Laterality Date   MOUTH SURGERY     wisdom teeth removed x 2    Family Psychiatric History:  Mother bipolar disorder, maternal grandmother anxiety and bipolar disorder, aunt bipolar disorder and anxiety   Family History:  Family History  Problem Relation Age of Onset   Migraines Mother    Migraines Brother    Irritable bowel syndrome Maternal Aunt    Colon cancer Neg Hx  Esophageal cancer Neg Hx     Social History:  Social History   Socioeconomic History   Marital status: Single    Spouse name: Not on file   Number of children: 0   Years of education: Not on file   Highest education level: 12th grade  Occupational History   Occupation:     Comment: Administrator  Tobacco Use   Smoking status: Never   Smokeless tobacco: Never  Vaping Use   Vaping status: Never Used  Substance and Sexual Activity   Alcohol use: No   Drug use: Never   Sexual activity: Yes    Birth control/protection:  Inserts  Other Topics Concern   Not on file  Social History Narrative   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 She does well in school.   Lives with her grandparents   Social Drivers of Health   Financial Resource Strain: Medium Risk (11/20/2022)   Overall Financial Resource Strain (CARDIA)    Difficulty of Paying Living Expenses: Somewhat hard  Food Insecurity: Food Insecurity Present (05/04/2024)   Hunger Vital Sign    Worried About Running Out of Food in the Last Year: Sometimes true    Ran Out of Food in the Last Year: Never true  Transportation Needs: No Transportation Needs (05/04/2024)   PRAPARE - Administrator, Civil Service (Medical): No    Lack of Transportation (Non-Medical): No  Physical Activity: Unknown (11/20/2022)   Exercise Vital Sign    Days of Exercise per Week: 0 days    Minutes of Exercise per Session: Not on file  Stress: Stress Concern Present (11/20/2022)   [employer] of Occupational Health - Occupational Stress Questionnaire    Feeling of Stress : Very much  Social Connections: Moderately Integrated (11/20/2022)   Social Connection and Isolation Panel    Frequency of Communication with Friends and Family: More than three times a week    Frequency of Social Gatherings with Friends and Family: Twice a week    Attends Religious Services: 1 to 4 times per year    Active Member of [employer] or Organizations: No    Attends Engineer, structural: Not on file    Marital Status: Living with partner    Allergies:  Allergies  Allergen Reactions   Kiwi Extract     Lip goes numb     Metabolic Disorder Labs: Lab Results  Component Value Date   HGBA1C 4.7 (L) 05/04/2024   MPG 88.19 05/04/2024   No results found for: PROLACTIN Lab Results  Component Value Date   CHOL 196 05/04/2024   TRIG 62 05/04/2024   HDL 99 05/04/2024   CHOLHDL 2.0 05/04/2024   VLDL 12 05/04/2024    LDLCALC 85 05/04/2024   Lab Results  Component Value Date   TSH 2.21 05/19/2022    Therapeutic Level Labs: No results found for: LITHIUM No results found for: VALPROATE No results found for: CBMZ  Current Medications: Current Outpatient Medications  Medication Sig Dispense Refill   fluvoxaMINE  (LUVOX ) 25 MG tablet Start 50 mg daily for one week, Then 50 mg in the morning and 50 mg in the evening for one week Then 100 mg in the morning and 50 mg in the evening for one week, Then, 100 mg in the morning and one in the evening 70 tablet 0   busPIRone  (BUSPAR ) 15 MG tablet Take 1 tablet (15 mg total)  by mouth 2 (two) times daily. 60 tablet 3   risperiDONE  (RISPERDAL ) 2 MG tablet Take 1 tablet (2 mg total) by mouth daily. 30 tablet 3   zonisamide  (ZONEGRAN ) 100 MG capsule Take 3 capsules (300 mg total) by mouth daily. 270 capsule 3   No current facility-administered medications for this visit.     Musculoskeletal: Strength & Muscle Tone: within normal limits and telehealth visit Gait & Station: normal, telehealth visit Patient leans: N/A  Psychiatric Specialty Exam: Review of Systems  Psychiatric/Behavioral:  Positive for dysphoric mood. The patient is nervous/anxious.   All other systems reviewed and are negative.   There were no vitals taken for this visit.There is no height or weight on file to calculate BMI.  General Appearance: Well Groomed  Eye Contact:  Good  Speech:  Clear and Coherent and Normal Rate  Volume:  Normal  Mood:  depressed, anxious  Affect:  Appropiate  Thought Process:  Coherent, Goal Directed, and Linear  Orientation:  Full (Time, Place, and Person)  Thought Content: WDL and Logical   Suicidal Thoughts:  No  Homicidal Thoughts:  No  Memory:  Immediate;   Good Recent;   Good Remote;   Good  Judgement:  Good  Insight:  Good  Psychomotor Activity:  Normal  Concentration:  Concentration: Good and Attention Span: Good  Recall:  Good  Fund of  Knowledge: Good  Language: Good  Akathisia:  No  Handed:  Right  AIMS (if indicated): not done  Assets:  Communication Skills Desire for Improvement Financial Resources/Insurance Housing Leisure Time Physical Health Social Support Talents/Skills Vocational/Educational  ADL's:  Intact  Cognition: WNL  Sleep:  Good   Screenings: AUDIT    Flowsheet Row [facility]  Alcohol Use Disorder Identification Test Final Score (AUDIT) 0   GAD-7    Flowsheet Row Video Visit from 03/31/2024 in [facility] Video Visit from 01/28/2024 in [facility] Office Visit from 11/19/2023 in [facility] Office Visit from 04/14/2022 in [facility] [place] Office Visit from 10/13/2021 in [facility]  Total GAD-7 Score 15 12 15 17 10    PHQ2-9    Flowsheet Row Clinical Support from 05/16/2024 in [facility] Video Visit from 03/31/2024 in [facility] Video Visit from 01/28/2024 in [facility] Office Visit from 11/19/2023 in [facility] Office Visit from 06/23/2022 in [facility] [place]  PHQ-2 Total Score 1 2 4 5 2   PHQ-9 Total Score 4 8 12 21 12    Flowsheet Row Clinical Support from 05/16/2024 in [facility] Admission (Discharged) [facility] ED from 05/03/2024 in [facility]  C-SSRS RISK CATEGORY Low Risk High Risk High Risk     Assessment and Plan: Patient reports that her anxiety is improving and still struggling with OCD counting and rechecking.  She is working with exposure therapy with some improvement, concerned about behaviors  increasing if she goes back to 3rd shift which she desires.  Depression is low.  Sleeping has increased which she contributes to 2nd shift which causes her to be up late and sleep late, misses her 7 days on/7 days off schedule.  1. OCD Increased fluvoxamine  (Luvox ) 25 mg daily to  50 mg daily for one week.  Then, 50 mg BID for one week.  Then, 100 mg in the am and 50 mg in the pm for one week.  Then, 100 mg BID.  Re-evaluate at the next visit. Dispense: 70 tablets. Continue- busPIRone  (BUSPAR ) 15 MG tablet; Take 1 tablet (15 mg total) by mouth 2 (two) times daily.  Dispense: 60 tablet; Refill: 3  2. Bipolar 2 disorder, major depressive episode (HCC) Continue- risperiDONE  (RISPERDAL ) 2 MG tablet; Take 1 tablet (2 mg total) by mouth daily.  Dispense: 30 tablet; Refill: 3  Collaboration of Care: Collaboration of Care: Other provider involved in patient's care AEB PCP  Follow-up in 3 months or sooner if needed, appointment in place.  Sharlot Becker, NP 06/27/2024, 1:18 PM Patient ID: Lindsay Rosario, female   DOB: 04/03/2001, 23 y.o.   MRN: [id number]

## 2024-06-28 ENCOUNTER — Telehealth (HOSPITAL_COMMUNITY): Admitting: Psychiatry

## 2024-07-04 ENCOUNTER — Encounter: Admitting: Family Medicine

## 2024-07-05 ENCOUNTER — Telehealth (HOSPITAL_COMMUNITY): Admitting: Psychiatry

## 2024-07-06 ENCOUNTER — Other Ambulatory Visit (HOSPITAL_COMMUNITY): Payer: Self-pay

## 2024-07-06 ENCOUNTER — Other Ambulatory Visit: Payer: Self-pay

## 2024-07-06 DIAGNOSIS — Z30431 Encounter for routine checking of intrauterine contraceptive device: Secondary | ICD-10-CM | POA: Diagnosis not present

## 2024-07-10 ENCOUNTER — Ambulatory Visit (HOSPITAL_COMMUNITY): Admitting: Clinical

## 2024-07-11 ENCOUNTER — Encounter: Admitting: Family Medicine

## 2024-07-13 DIAGNOSIS — F428 Other obsessive-compulsive disorder: Secondary | ICD-10-CM | POA: Diagnosis not present

## 2024-07-13 NOTE — Telephone Encounter (Signed)
 Disregard

## 2024-07-20 ENCOUNTER — Other Ambulatory Visit (HOSPITAL_COMMUNITY): Payer: Self-pay

## 2024-07-20 DIAGNOSIS — F428 Other obsessive-compulsive disorder: Secondary | ICD-10-CM | POA: Diagnosis not present

## 2024-07-24 ENCOUNTER — Encounter: Admitting: Family Medicine

## 2024-08-04 ENCOUNTER — Telehealth (HOSPITAL_COMMUNITY): Payer: Self-pay | Admitting: Psychiatry

## 2024-08-04 DIAGNOSIS — F428 Other obsessive-compulsive disorder: Secondary | ICD-10-CM | POA: Diagnosis not present

## 2024-08-07 ENCOUNTER — Other Ambulatory Visit (HOSPITAL_COMMUNITY): Payer: Self-pay | Admitting: Psychiatry

## 2024-08-07 ENCOUNTER — Other Ambulatory Visit (HOSPITAL_COMMUNITY): Payer: Self-pay

## 2024-08-07 MED ORDER — FLUVOXAMINE MALEATE 100 MG PO TABS
100.0000 mg | ORAL_TABLET | Freq: Two times a day (BID) | ORAL | 3 refills | Status: DC
  Filled 2024-08-07: qty 30, 15d supply, fill #0

## 2024-08-07 NOTE — Telephone Encounter (Signed)
 Patient informed Clinical research associate that she has successfully tapered up to 100 mg twice daily.  Today provider reordered Luvox  100 mg to be taken twice daily.

## 2024-08-08 DIAGNOSIS — H52203 Unspecified astigmatism, bilateral: Secondary | ICD-10-CM | POA: Diagnosis not present

## 2024-08-09 ENCOUNTER — Other Ambulatory Visit (HOSPITAL_COMMUNITY): Payer: Self-pay

## 2024-08-09 ENCOUNTER — Encounter (HOSPITAL_COMMUNITY): Payer: Self-pay | Admitting: Psychiatry

## 2024-08-09 ENCOUNTER — Other Ambulatory Visit: Payer: Self-pay

## 2024-08-09 ENCOUNTER — Telehealth (HOSPITAL_COMMUNITY): Admitting: Psychiatry

## 2024-08-09 DIAGNOSIS — F3181 Bipolar II disorder: Secondary | ICD-10-CM | POA: Diagnosis not present

## 2024-08-09 DIAGNOSIS — F411 Generalized anxiety disorder: Secondary | ICD-10-CM

## 2024-08-09 MED ORDER — RISPERIDONE 2 MG PO TABS
2.0000 mg | ORAL_TABLET | Freq: Every day | ORAL | 3 refills | Status: DC
  Filled 2024-08-09 – 2024-08-23 (×2): qty 30, 30d supply, fill #0
  Filled 2024-09-04: qty 30, 30d supply, fill #1

## 2024-08-09 MED ORDER — FLUVOXAMINE MALEATE 100 MG PO TABS
100.0000 mg | ORAL_TABLET | Freq: Two times a day (BID) | ORAL | 3 refills | Status: DC
  Filled 2024-08-09 – 2024-08-23 (×2): qty 30, 15d supply, fill #0
  Filled 2024-09-04: qty 30, 15d supply, fill #1
  Filled 2024-11-01: qty 30, 15d supply, fill #2

## 2024-08-09 MED ORDER — BUSPIRONE HCL 15 MG PO TABS
15.0000 mg | ORAL_TABLET | Freq: Two times a day (BID) | ORAL | 3 refills | Status: DC
  Filled 2024-08-09 – 2024-09-04 (×2): qty 60, 30d supply, fill #0
  Filled 2024-11-01: qty 60, 30d supply, fill #1

## 2024-08-09 NOTE — Progress Notes (Signed)
 BH MD/PA/NP OP Progress Note Virtual Visit via Video Note  I connected with Lindsay Rosario on 08/09/24 at 11:30 AM EDT by a video enabled telemedicine application and verified that I am speaking with the correct person using two identifiers.  Location: Patient: Home Provider: Clinic   I discussed the limitations of evaluation and management by telemedicine and the availability of in person appointments. The patient expressed understanding and agreed to proceed.  I provided 30 minutes of non-face-to-face time during this encounter.   08/09/2024 11:54 AM Lindsay Rosario  MRN:  [id number]  Chief Complaint: I have been working on things with my therapist  HPI:  23 year old female seen today for follow-up psychiatric evaluation.  She has a psychiatric history of anxiety and depression.  Currently she is managed on Risperdal  2 mg daily (patient works at night), BuSpar  15 mg twice daily and Luvox  100 mg twice daily.  She reports her medications are effective in managing her psychiatric conditions.  Today she is well-groomed, pleasant, cooperative, and engaged in conversation.  She informed Clinical research associate that she has been working on things with her therapist. She notes that she has been learning to set boundaries and identify triggers. Patient notes that one boundary she has set is making sure that individuals don't interrupt her bed time. She notes that when her sleep is interrupted she is more likely to slip into a depressive state. She also notes that she has identified that when she depressed she isolates and sleeps. She informed Clinical research associate that she attempts to not bed surf and surround her self with positive people.      Patient informed writer that her medications and therapy are effective in helping her manage her anxiety and depression. Today provider conducted a GAD-7 and patient scored a 10.   Provider also conducted PHQ-9 the patient scored a 8, at her last visit she scored a 10. Today she denies  SI/HI/VAH, mania, paranoia.  No medication changes made.  Patient agreeable to continue medication as prescribed.  No other concerns noted at time.   Visit Diagnosis:    ICD-10-CM   1. Generalized anxiety disorder  F41.1 busPIRone  (BUSPAR ) 15 MG tablet    2. Bipolar 2 disorder, major depressive episode (HCC)  F31.81 risperiDONE  (RISPERDAL ) 2 MG tablet        Past Psychiatric History:  Anxiety and depression   Past Medical History:  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   Routine general medical examination at a health care facility 06/25/2023   Seizures [facility])     Past Surgical History:  Procedure Laterality Date   MOUTH SURGERY     wisdom teeth removed x 2    Family Psychiatric History:  Mother bipolar disorder, maternal grandmother anxiety and bipolar disorder, aunt bipolar disorder and anxiety   Family History:  Family History  Problem Relation Age of Onset   Migraines Mother    Migraines Brother    Irritable bowel syndrome Maternal Aunt    Colon cancer Neg Hx    Esophageal cancer Neg Hx     Social History:  Social History   Socioeconomic History   Marital status: Single    Spouse name: Not on file   Number of children: 0   Years of education: Not on file   Highest  education level: 12th grade  Occupational History   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 She  does well in school.   Lives with her grandparents   Social Drivers of Health   Financial Resource Strain: Medium Risk (11/20/2022)   Overall Financial Resource Strain (CARDIA)    Difficulty of Paying Living Expenses: Somewhat hard  Food Insecurity: Food Insecurity Present (05/04/2024)   Hunger Vital Sign    Worried About Running Out of Food in the Last Year: Sometimes true    Ran Out of Food in the Last Year: Never true  Transportation Needs: No Transportation Needs (05/04/2024)   PRAPARE - Administrator, Civil Service (Medical): No    Lack of Transportation (Non-Medical): No  Physical Activity: Unknown (11/20/2022)   Exercise Vital Sign    Days of Exercise per Week: 0 days    Minutes of Exercise per Session: Not on file  Stress: Stress Concern Present (11/20/2022)   [employer] of Occupational Health - Occupational Stress Questionnaire    Feeling of Stress : Very much  Social Connections: Moderately Integrated (11/20/2022)   Social Connection and Isolation Panel    Frequency of Communication with Friends and Family: More than three times a week    Frequency of Social Gatherings with Friends and Family: Twice a week    Attends Religious Services: 1 to 4 times per year    Active Member of [employer] or Organizations: No    Attends Engineer, structural: Not on file    Marital Status: Living with partner    Allergies:  Allergies  Allergen Reactions   Kiwi Extract     Lip goes numb     Metabolic Disorder Labs: Lab Results  Component Value Date   HGBA1C 4.7 (L) 05/04/2024   MPG 88.19 05/04/2024   No results found for: PROLACTIN Lab Results  Component Value Date   CHOL 196 05/04/2024   TRIG 62 05/04/2024   HDL 99 05/04/2024   CHOLHDL 2.0 05/04/2024   VLDL 12 05/04/2024   LDLCALC 85 05/04/2024   Lab Results  Component Value Date   TSH 2.21 05/19/2022    Therapeutic Level Labs: No results found for: LITHIUM No results found for:  VALPROATE No results found for: CBMZ  Current Medications: Current Outpatient Medications  Medication Sig Dispense Refill   busPIRone  (BUSPAR ) 15 MG tablet Take 1 tablet (15 mg total) by mouth 2 (two) times daily. 60 tablet 3   fluvoxaMINE  (LUVOX ) 100 MG tablet Take 1 tablet (100 mg total) by mouth 2 (two) times daily. 30 tablet 3   risperiDONE  (RISPERDAL ) 2 MG tablet Take 1 tablet (2 mg total) by mouth daily. 30 tablet 3   zonisamide  (ZONEGRAN ) 100 MG capsule Take 3 capsules (300 mg total) by mouth daily. 270 capsule 3   No current facility-administered medications for this visit.     Musculoskeletal: Strength & Muscle Tone: within normal limits and telehealth visit Gait & Station: normal, telehealth visit Patient leans: N/A  Psychiatric Specialty  Exam: Review of Systems  There were no vitals taken for this visit.There is no height or weight on file to calculate BMI.  General Appearance: Well Groomed  Eye Contact:  Good  Speech:  Clear and Coherent and Normal Rate  Volume:  Normal  Mood:  Euthymic  Affect:  Appropriate and Congruent  Thought Process:  Coherent, Goal Directed, and Linear  Orientation:  Full (Time, Place, and Person)  Thought Content: WDL and Logical   Suicidal Thoughts:  No  Homicidal Thoughts:  No  Memory:  Immediate;   Good Recent;   Good Remote;   Good  Judgement:  Good  Insight:  Good  Psychomotor Activity:  Normal  Concentration:  Concentration: Good and Attention Span: Good  Recall:  Good  Fund of Knowledge: Good  Language: Good  Akathisia:  No  Handed:  Right  AIMS (if indicated): not done  Assets:  Communication Skills Desire for Improvement Financial Resources/Insurance Housing Leisure Time Physical Health Social Support Talents/Skills Vocational/Educational  ADL's:  Intact  Cognition: WNL  Sleep:  Good   Screenings: AUDIT    Flowsheet Row [facility]   Alcohol Use Disorder Identification Test Final Score (AUDIT) 0   GAD-7    Flowsheet Row Video Visit from 08/09/2024 in [facility] Video Visit from 03/31/2024 in [facility] Video Visit from 01/28/2024 in [facility] Office Visit from 11/19/2023 in [facility] Office Visit from 04/14/2022 in [facility] [place]  Total GAD-7 Score 10 15 12 15 17    PHQ2-9    Flowsheet Row Video Visit from 08/09/2024 in [facility] Clinical Support from 05/16/2024 in [facility] Video Visit from 03/31/2024 in [facility] Video Visit from 01/28/2024 in [facility] Office Visit from 11/19/2023 in [facility]  PHQ-2 Total Score 2 1 2 4 5   PHQ-9 Total Score 10 4 8 12 21    Flowsheet Row Clinical Support from 05/16/2024 in [facility] Admission (Discharged) [facility] from 05/03/2024 in [facility] Emergency Department at [facility]  C-SSRS RISK CATEGORY Low Risk High Risk High Risk     Assessment and Plan: Patient reports that she is doing well on her current medication regimen. No medication changes made today. Patient agreeable to continue medications as prescribed.   1. Generalized anxiety disorder  Continue- fluvoxaMINE  (LUVOX ) 100 MG tablet; Take 1 tablet (100 mg total) by mouth 2 (two) times daily.  Dispense: 30 tablet; Refill: 3 Continue- busPIRone  (BUSPAR ) 15 MG tablet; Take 1 tablet (15 mg total) by mouth 2 (two) times daily.  Dispense: 60 tablet; Refill: 3  2. Bipolar 2 disorder, major depressive episode (HCC)  Continue- risperiDONE  (RISPERDAL ) 2 MG tablet; Take 1 tablet (2 mg total) by mouth daily.  Dispense: 30 tablet; Refill: 3  Consent:  Patient/Guardian gives verbal consent for treatment and assignment of benefits for services provided during this visit. Patient/Guardian expressed understanding and agreed to proceed.   Follow-up in 3 months Zane FORBES Bach, NP 08/09/2024, 11:54 AM

## 2024-08-17 ENCOUNTER — Other Ambulatory Visit: Payer: Self-pay | Admitting: Medical Genetics

## 2024-08-17 DIAGNOSIS — Z006 Encounter for examination for normal comparison and control in clinical research program: Secondary | ICD-10-CM

## 2024-08-18 DIAGNOSIS — F428 Other obsessive-compulsive disorder: Secondary | ICD-10-CM | POA: Diagnosis not present

## 2024-08-21 ENCOUNTER — Other Ambulatory Visit (HOSPITAL_COMMUNITY): Payer: Self-pay

## 2024-08-23 ENCOUNTER — Other Ambulatory Visit (HOSPITAL_COMMUNITY): Payer: Self-pay

## 2024-09-04 ENCOUNTER — Other Ambulatory Visit (HOSPITAL_COMMUNITY): Payer: Self-pay

## 2024-09-04 MED ORDER — DOXYCYCLINE HYCLATE 100 MG PO TABS
100.0000 mg | ORAL_TABLET | Freq: Two times a day (BID) | ORAL | 0 refills | Status: DC
  Filled 2024-09-04: qty 14, 7d supply, fill #0

## 2024-09-05 ENCOUNTER — Other Ambulatory Visit (HOSPITAL_COMMUNITY): Payer: Self-pay

## 2024-09-05 ENCOUNTER — Other Ambulatory Visit: Payer: Self-pay

## 2024-09-05 DIAGNOSIS — F428 Other obsessive-compulsive disorder: Secondary | ICD-10-CM | POA: Diagnosis not present

## 2024-09-18 ENCOUNTER — Encounter: Payer: Self-pay | Admitting: Family Medicine

## 2024-09-18 ENCOUNTER — Other Ambulatory Visit (HOSPITAL_COMMUNITY): Payer: Self-pay

## 2024-09-18 ENCOUNTER — Ambulatory Visit (INDEPENDENT_AMBULATORY_CARE_PROVIDER_SITE_OTHER): Admitting: Family Medicine

## 2024-09-18 ENCOUNTER — Other Ambulatory Visit (HOSPITAL_COMMUNITY)
Admission: RE | Admit: 2024-09-18 | Discharge: 2024-09-18 | Disposition: A | Discharge status: Home / Self Care | Source: Ambulatory Visit | Attending: Family Medicine | Admitting: Family Medicine

## 2024-09-18 VITALS — BP 110/70 | HR 81 | Temp 98.8°F | Resp 16 | Ht 65.0 in | Wt 124.0 lb

## 2024-09-18 DIAGNOSIS — N390 Urinary tract infection, site not specified: Secondary | ICD-10-CM

## 2024-09-18 DIAGNOSIS — R300 Dysuria: Secondary | ICD-10-CM | POA: Diagnosis not present

## 2024-09-18 DIAGNOSIS — Z113 Encounter for screening for infections with a predominantly sexual mode of transmission: Secondary | ICD-10-CM

## 2024-09-18 DIAGNOSIS — N898 Other specified noninflammatory disorders of vagina: Secondary | ICD-10-CM | POA: Diagnosis not present

## 2024-09-18 DIAGNOSIS — R319 Hematuria, unspecified: Secondary | ICD-10-CM | POA: Diagnosis not present

## 2024-09-18 LAB — POCT URINALYSIS DIPSTICK
Bilirubin, UA: POSITIVE
Blood, UA: POSITIVE
Glucose, UA: POSITIVE — AB
Ketones, UA: POSITIVE
Nitrite, UA: POSITIVE
Protein, UA: POSITIVE — AB
Spec Grav, UA: 1.015 (ref 1.010–1.025)
Urobilinogen, UA: >=8 U/dL — AB
pH, UA: 7 (ref 5.0–8.0)

## 2024-09-18 MED ORDER — METRONIDAZOLE 0.75 % VA GEL
1.0000 | Freq: Every day | VAGINAL | 0 refills | Status: AC
  Filled 2024-09-18: qty 70, 5d supply, fill #0

## 2024-09-18 MED ORDER — NITROFURANTOIN MONOHYD MACRO 100 MG PO CAPS
100.0000 mg | ORAL_CAPSULE | Freq: Two times a day (BID) | ORAL | 0 refills | Status: AC
  Filled 2024-09-18: qty 10, 5d supply, fill #0

## 2024-09-18 NOTE — Patient Instructions (Signed)
 A few things to remember from today's visit:  Dysuria - Plan: POC Urinalysis Dipstick, Urine Culture, Urine Culture  Screen for STD (sexually transmitted disease)  Vaginal discharge - Plan: Urine cytology ancillary only, metroNIDAZOLE (METROGEL) 0.75 % vaginal gel  Urinary tract infection with hematuria, site unspecified - Plan: nitrofurantoin , macrocrystal-monohydrate, (MACROBID ) 100 MG capsule  Metronidazole vaginally for possible bacterial vaginosis.   Adequate fluid intake, avoid holding urine for long hours, and over the counter Vit C OR cranberry capsules might help.  Today we will treat empirically with antibiotic, which we might need to change when urine culture comes back depending of bacteria susceptibility.  Seek immediate medical attention if severe abdominal pain, vomiting, fever/chills, or worsening symptoms. F/U if symptomatic are not any better after 2-3 days of antibiotic treatment.  Do not use My Chart to request refills or for acute issues that need immediate attention. If you send a my chart message, it may take a few days to be addressed, specially if I am not in the office.  Please be sure medication list is accurate. If a new problem present, please set up appointment sooner than planned today.

## 2024-09-18 NOTE — Progress Notes (Signed)
 ACUTE VISIT Chief Complaint  Patient presents with   Dysuria    Pt c/o burning when urinate. Sx started 2 days ago. Would like to get STD check- had unprotected sex. Pt reports no discharge. Reports one day smell.    Discussed the use of AI scribe software for clinical note transcription with the patient, who gave verbal consent to proceed.  History of Present Illness Lindsay Rosario is a 23 year old female with a PMHx significant for epilepsy, insomnia, GAD, depression, bipolar II, and B12 deficiency who presents with symptoms suggestive of a urinary tract infection as described above.  She has been experiencing dysuria and gross hematuria for the past two days, along with urinary frequency and urgency. AZO provided some relief. No history of nephrolithiasis is reported.  She reports nausea  and occasional suprapubic pain over the past two days. The nausea is intermittent, and she has experienced mild diarrhea. She is sexually active, no hx of pyelonephritis.  -She is also requesting STD screening. No known exposure.  She mentions a recent change in vaginal odor (a day), described as fishy, occurring after sexual intercourse with a new partner. She denies any current vaginal discharge but a few days ago she had one episode of copious whitish vaginal discharge that lasted less than a day.  Negative for dyspareunia or vaginal bleeding.  Review of Systems  Constitutional:  Negative for activity change, appetite change, chills and fever.  Respiratory:  Negative for cough and shortness of breath.   Cardiovascular:  Negative for leg swelling.  Gastrointestinal:  Negative for vomiting.  Genitourinary:  Negative for decreased urine volume, difficulty urinating, flank pain and genital sores.  Musculoskeletal:  Negative for joint swelling and myalgias.  Skin:  Negative for rash.  Neurological:  Negative for syncope and weakness.  See other pertinent positives and negatives in  HPI.  Current Outpatient Medications on File Prior to Visit  Medication Sig Dispense Refill   busPIRone  (BUSPAR ) 15 MG tablet Take 1 tablet (15 mg total) by mouth 2 (two) times daily. 60 tablet 3   fluvoxaMINE  (LUVOX ) 100 MG tablet Take 1 tablet (100 mg total) by mouth 2 (two) times daily. 30 tablet 3   levonorgestrel (KYLEENA) 19.5 MG IUD one device provided by care center     zonisamide  (ZONEGRAN ) 100 MG capsule Take 3 capsules (300 mg total) by mouth daily. 270 capsule 3   risperiDONE  (RISPERDAL ) 2 MG tablet Take 1 tablet (2 mg total) by mouth daily. (Patient not taking: Reported on 09/18/2024) 30 tablet 3   No current facility-administered medications on file prior to visit.   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   Routine general medical examination at a health care facility 06/25/2023   Seizures (HCC)    Allergies  Allergen Reactions   Kiwi Extract     Lip goes numb    Social History   Socioeconomic History   Marital status: Single    Spouse name: Not on file   Number of children: 0   Years of education: Not on file   Highest education level: 12th grade  Occupational History   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 She does well in school.   Lives with her grandparents   Social Drivers of Health   Financial Resource Strain: Medium Risk (11/20/2022)   Overall Financial  Resource Strain (CARDIA)    Difficulty of Paying Living Expenses: Somewhat hard  Food Insecurity: Food Insecurity Present (05/04/2024)   Hunger Vital Sign    Worried About Running Out of Food in the Last Year: Sometimes true    Ran Out of Food in the Last Year: Never true  Transportation Needs: No Transportation Needs (05/04/2024)   PRAPARE - Administrator, Civil Service (Medical): No    Lack of Transportation (Non-Medical): No  Physical Activity: Unknown (11/20/2022)   Exercise Vital Sign    Days of Exercise per Week: 0 days    Minutes of Exercise per Session: Not on file  Stress: Stress Concern Present (11/20/2022)   [employer] of Occupational Health - Occupational Stress Questionnaire    Feeling of Stress : Very much  Social Connections: Moderately Integrated (11/20/2022)   Social Connection and Isolation Panel    Frequency of Communication with Friends and Family: More than three times a week    Frequency of Social Gatherings with Friends and Family: Twice a week    Attends Religious Services: 1 to 4 times per year    Active Member of [employer] or Organizations: No    Attends Banker Meetings: Not on file    Marital Status: Living with partner   Vitals:   09/18/24 1605  BP: 110/70  Pulse: 81  Resp: 16  Temp: 98.8 F (37.1 C)  SpO2: 96%   Body mass index is 20.63 kg/m.  Physical Exam Vitals and nursing note reviewed.  Constitutional:      General: She is not in acute distress.    Appearance: She is well-developed.  HENT:     Head: Normocephalic and atraumatic.  Eyes:     Conjunctiva/sclera: Conjunctivae normal.  Cardiovascular:     Rate and Rhythm: Normal rate and regular rhythm.     Heart sounds: No murmur heard. Pulmonary:     Effort: Pulmonary effort is normal. No respiratory distress.     Breath sounds: Normal breath sounds.  Abdominal:     Palpations: Abdomen is soft. There is no mass.     Tenderness: There is no abdominal  tenderness. There is no right CVA tenderness or left CVA tenderness.  Genitourinary:    Comments: Deferred to gynecologist/next visit. Skin:    General: Skin is warm.     Findings: No erythema.  Neurological:     General: No focal deficit present.     Mental Status: She is alert and oriented to person, place, and time.     Gait: Gait normal.  Psychiatric:        Mood and Affect: Mood and affect normal.    ASSESSMENT AND PLAN:  Lindsay Rosario was seen today for urinary symptoms and vaginal odor.  Urinary tract infection with hematuria, site unspecified Reporting 2 days of dysuria, suprapubic abdominal pain,and gross hematuria. Empiric abx treatment with Macrobid  started today and will be tailored according to Ucx results  and susceptibility report. Clearly instructed about warning signs. F/U if symptoms persist.  -     Nitrofurantoin  Monohyd Macro; Take 1 capsule (100 mg total) by mouth 2 (two) times daily for 5 days.  Dispense: 10 capsule; Refill: 0  Dysuria Improved after starting OTC Azo. We discussed differential diagnosis. Urine dipstick today with several abnormalities, son may be related with the fact that she took Azo.  Positive for glucose, bilirubin, ketones, protein, nitrite, and 3+ leukocytes.  She had a hemoglobin A1c 4 months ago of 4.7. Will send urine for culture.  -     POCT urinalysis dipstick -     Urine Culture; Future  Vaginal discharge One-time episode, now having vaginal odor exacerbated by sex intercourse. History suggestive of BV, recommend treatment with vaginal metronidazole. Further recommendation will be given according to urine cytology result.  Follow-up here or with her gynecologist if needed.  -     Urine cytology ancillary only -     metroNIDAZOLE; Place 1 Applicatorful vaginally at bedtime for 5 days.  Dispense: 70 g; Refill: 0  Screen for STD (sexually transmitted disease) -     Urine cytology ancillary only  Return if symptoms  worsen or fail to improve, for keep next appointment.  Volanda Mangine G. Bach Rocchi, MD  [facility]. [place] office.

## 2024-09-19 ENCOUNTER — Other Ambulatory Visit: Payer: Self-pay

## 2024-09-19 LAB — URINE CYTOLOGY ANCILLARY ONLY
Chlamydia: NEGATIVE
Comment: NEGATIVE
Comment: NEGATIVE
Comment: NORMAL
Neisseria Gonorrhea: NEGATIVE
Trichomonas: NEGATIVE

## 2024-09-20 ENCOUNTER — Ambulatory Visit: Payer: Self-pay | Admitting: Family Medicine

## 2024-09-21 LAB — URINE CULTURE
MICRO NUMBER:: 17244237
SPECIMEN QUALITY:: ADEQUATE

## 2024-09-22 DIAGNOSIS — F428 Other obsessive-compulsive disorder: Secondary | ICD-10-CM | POA: Diagnosis not present

## 2024-09-30 ENCOUNTER — Telehealth: Admitting: Nurse Practitioner

## 2024-09-30 DIAGNOSIS — R309 Painful micturition, unspecified: Secondary | ICD-10-CM | POA: Diagnosis not present

## 2024-09-30 DIAGNOSIS — R350 Frequency of micturition: Secondary | ICD-10-CM

## 2024-09-30 MED ORDER — CEPHALEXIN 500 MG PO CAPS: 500.0000 mg | ORAL_CAPSULE | Freq: Two times a day (BID) | ORAL | 0 refills | Status: AC

## 2024-09-30 NOTE — Progress Notes (Signed)

## 2024-11-03 ENCOUNTER — Other Ambulatory Visit (HOSPITAL_COMMUNITY): Payer: Self-pay

## 2024-11-03 ENCOUNTER — Encounter (HOSPITAL_COMMUNITY): Payer: Self-pay | Admitting: Psychiatry

## 2024-11-03 ENCOUNTER — Telehealth (INDEPENDENT_AMBULATORY_CARE_PROVIDER_SITE_OTHER): Admitting: Psychiatry

## 2024-11-03 DIAGNOSIS — F411 Generalized anxiety disorder: Secondary | ICD-10-CM | POA: Diagnosis not present

## 2024-11-03 DIAGNOSIS — R41840 Attention and concentration deficit: Secondary | ICD-10-CM | POA: Diagnosis not present

## 2024-11-03 MED ORDER — ATOMOXETINE HCL 40 MG PO CAPS
40.0000 mg | ORAL_CAPSULE | Freq: Every day | ORAL | 3 refills | Status: AC
  Filled 2024-11-03: qty 90, 90d supply, fill #0
  Filled 2024-11-03: qty 30, 30d supply, fill #0

## 2024-11-03 MED ORDER — BUSPIRONE HCL 30 MG PO TABS
30.0000 mg | ORAL_TABLET | Freq: Two times a day (BID) | ORAL | 3 refills | Status: AC
  Filled 2024-11-03 (×2): qty 180, 90d supply, fill #0
  Filled 2024-11-03: qty 60, 30d supply, fill #0

## 2024-11-03 MED ORDER — FLUVOXAMINE MALEATE 100 MG PO TABS
100.0000 mg | ORAL_TABLET | Freq: Two times a day (BID) | ORAL | 3 refills | Status: AC
  Filled 2024-12-04: qty 30, 15d supply, fill #0

## 2024-11-03 NOTE — Progress Notes (Signed)
 [facility] MD/PA/NP OP Progress Note Virtual Visit via Video Note  I connected with Lindsay Rosario on 11/03/2024 at 10:00 AM EST by a video enabled telemedicine application and verified that I am speaking with the correct person using two identifiers.  Location: Patient: Home Provider: Clinic   I discussed the limitations of evaluation and management by telemedicine and the availability of in person appointments. The patient expressed understanding and agreed to proceed.  I provided 30 minutes of non-face-to-face time during this encounter.   11/03/2024 12:03 PM Lindsay Rosario  MRN:  [id number]  Chief Complaint: I stopped taking Risperdal   HPI:  24 year old female seen today for follow-up psychiatric evaluation.  She has a psychiatric history of anxiety and depression.  Currently she is managed on Risperdal  2 mg daily (patient works at night), BuSpar  15 mg twice daily and Luvox  100 mg twice daily.  She notes that she discontinued Risperdal  and reports that her other medications are somewhat effective in managing her psychiatric conditions.  Today she is well-groomed, pleasant, cooperative, and engaged in conversation.  She informed clinical research associate that she stopped taking the Risperdal . She notes that it made her feel groggy. She also notes that she no longer feels that Buspar  is working. She notes that it caused her to want to bed rot. Patient notes that she has been trying to be more consistent with her medication.  Since her last visit she notes that BuSpar  has not been as effective in managing her anxiety and depression. Today provider conducted a GAD-7 and patient scored a 15, at her last appointment she scored a 10.   Provider also conducted PHQ-9 the patient scored a 11, at her last visit she scored a 8. Today she denies SI/HI/VAH, mania, paranoia.  She reports that her sleep fluctuates depending on the days that she work.  She notes that she sleeps approximately 8 to 11 hours.  At times patient  notes that she has poor concentration. She notes that she has to constantly remind her self to complete task. She reports that she is inattentive to mentally taxing task. She notes that she got ADHD testing [facility] (Dr. Feliciano Herd) in 2024 and was diagnosed with ADD. Provider request medical records.  She reports that she will work on getting them.  Patient informed writer that her therapy has been somewhat effective in helping her manage inattentiveness.  She notes that she has been learning how to write task down, stay on a specific routine, and have reminder apps on her phone.   Today BuSpar  15 mg twice daily increased to 30 mg twice daily to help manage anxiety depression.  Patient also agreeable to starting Strattera 40 mg daily to help manage concentration.  Provider requested patient get her medical records from [facility].  If ineffective patient will be referred to [facility].  She endorsed understanding and agreed.  No other concerns noted at time.   Visit Diagnosis:    ICD-10-CM   1. Poor concentration  R41.840 atomoxetine (STRATTERA) 40 MG capsule    2. Generalized anxiety disorder  F41.1 fluvoxaMINE  (LUVOX ) 100 MG tablet    busPIRone  (BUSPAR ) 30 MG tablet         Past Psychiatric History:  Anxiety and depression   Past Medical History:  Past Medical History:  Diagnosis Date   GAD (generalized anxiety disorder) 04/14/2022   Headache(784.0)    Lactose intolerance    she said she can do some things   Moderately severe recurrent major  depression (HCC) 04/14/2022   Routine general medical examination at a health care facility 06/25/2023   Seizures [facility])     Past Surgical History:  Procedure Laterality Date   MOUTH SURGERY     wisdom teeth removed x 2    Family Psychiatric History:  Mother bipolar disorder, maternal grandmother anxiety and bipolar disorder, aunt bipolar disorder and anxiety   Family History:  Family History  Problem Relation Age of Onset    Migraines Mother    Migraines Brother    Irritable bowel syndrome Maternal Aunt    Colon cancer Neg Hx    Esophageal cancer Neg Hx     Social History:  Social History   Socioeconomic History   Marital status: Single    Spouse name: Not on file   Number of children: 0   Years of education: Not on file   Highest education level: 12th grade  Occupational History   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 She does well in school.   Lives with her grandparents   Social Drivers of Health   Tobacco Use: Low Risk (11/03/2024)   Patient History    Smoking Tobacco Use: Never    Smokeless Tobacco Use: Never    Passive Exposure: Not on file  Financial Resource Strain: Medium Risk (11/20/2022)   Overall Financial Resource Strain (CARDIA)    Difficulty of Paying Living Expenses: Somewhat hard  Food Insecurity: Food Insecurity Present (05/04/2024)   Epic    Worried About Programme Researcher, Broadcasting/film/video in the Last Year: Sometimes true    Ran Out of Food in the Last Year: Never true  Transportation Needs: No Transportation Needs (05/04/2024)   Epic    Lack of Transportation (Medical): No    Lack of Transportation (Non-Medical): No  Physical Activity: Unknown (11/20/2022)   Exercise Vital Sign    Days of Exercise per Week: 0 days    Minutes of Exercise per Session: Not on file  Stress: Stress Concern Present (11/20/2022)   [employer] of Occupational Health - Occupational Stress Questionnaire    Feeling of Stress : Very much  Social Connections: Moderately Integrated (11/20/2022)   Social  Connection and Isolation Panel    Frequency of Communication with Friends and Family: More than three times a week    Frequency of Social Gatherings with Friends and Family: Twice a week    Attends Religious Services: 1 to 4 times per year    Active Member of Clubs or Organizations: No    Attends Banker Meetings: Not on file    Marital Status: Living with partner  Depression (PHQ2-9): High Risk (11/03/2024)   Depression (PHQ2-9)    PHQ-2 Score: 11  Alcohol Screen: Low Risk (05/04/2024)   Alcohol Screen    Last Alcohol Screening Score (AUDIT): 0  Housing: Low Risk (05/04/2024)   Epic    Unable to Pay for Housing in the Last Year: No    Number of Times Moved in the Last Year: 1  Homeless in the Last Year: No  Utilities: Not At Risk (05/04/2024)   Epic    Threatened with loss of utilities: No  Health Literacy: Not on file    Allergies:  Allergies  Allergen Reactions   Kiwi Extract     Lip goes numb     Metabolic Disorder Labs: Lab Results  Component Value Date   HGBA1C 4.7 (L) 05/04/2024   MPG 88.19 05/04/2024   No results found for: PROLACTIN Lab Results  Component Value Date   CHOL 196 05/04/2024   TRIG 62 05/04/2024   HDL 99 05/04/2024   CHOLHDL 2.0 05/04/2024   VLDL 12 05/04/2024   LDLCALC 85 05/04/2024   Lab Results  Component Value Date   TSH 2.21 05/19/2022    Therapeutic Level Labs: No results found for: LITHIUM No results found for: VALPROATE No results found for: CBMZ  Current Medications: Current Outpatient Medications  Medication Sig Dispense Refill   atomoxetine (STRATTERA) 40 MG capsule Take 1 capsule (40 mg total) by mouth daily. 30 capsule 3   busPIRone  (BUSPAR ) 30 MG tablet Take 1 tablet (30 mg total) by mouth 2 (two) times daily. 60 tablet 3   fluvoxaMINE  (LUVOX ) 100 MG tablet Take 1 tablet (100 mg total) by mouth 2 (two) times daily. 30 tablet 3   levonorgestrel  (KYLEENA ) 19.5 MG IUD one device provided by care center      zonisamide  (ZONEGRAN ) 100 MG capsule Take 3 capsules (300 mg total) by mouth daily. 270 capsule 3   No current facility-administered medications for this visit.     Musculoskeletal: Strength & Muscle Tone: within normal limits and telehealth visit Gait & Station: normal, telehealth visit Patient leans: N/A  Psychiatric Specialty Exam: Review of Systems  There were no vitals taken for this visit.There is no height or weight on file to calculate BMI.  General Appearance: Well Groomed  Eye Contact:  Good  Speech:  Clear and Coherent and Normal Rate  Volume:  Normal  Mood:  Euthymic  Affect:  Appropriate and Congruent  Thought Process:  Coherent, Goal Directed, and Linear  Orientation:  Full (Time, Place, and Person)  Thought Content: WDL and Logical   Suicidal Thoughts:  No  Homicidal Thoughts:  No  Memory:  Immediate;   Good Recent;   Good Remote;   Good  Judgement:  Good  Insight:  Good  Psychomotor Activity:  Normal  Concentration:  Concentration: Good and Attention Span: Good  Recall:  Good  Fund of Knowledge: Good  Language: Good  Akathisia:  No  Handed:  Right  AIMS (if indicated): not done  Assets:  Communication Skills Desire for Improvement Financial Resources/Insurance Housing Leisure Time Physical Health Social Support Talents/Skills Vocational/Educational  ADL's:  Intact  Cognition: WNL  Sleep:  Good   Screenings: AUDIT    Flowsheet Row [facility]  Alcohol Use Disorder Identification Test Final Score (AUDIT) 0   GAD-7    Flowsheet Row Video Visit from 11/03/2024 in [facility] Video Visit from 08/09/2024 in [facility] Video Visit from 03/31/2024 in [facility] Video Visit from 01/28/2024 in [facility] Visit from 11/19/2023 in [facility]  Total GAD-7 Score 15 10 15 12 15    PHQ2-9    Flowsheet Row Video Visit from 11/03/2024 in [facility] Video Visit from 08/09/2024 in  [facility] Clinical Support from 05/16/2024 in [facility] Video Visit from 03/31/2024 in [facility] Video Visit from 01/28/2024 in [facility]  PHQ-2 Total Score 2 2 1 2 4   PHQ-9 Total Score 11 10 4 8 12    Flowsheet Row Clinical Support from 05/16/2024 in [facility] Admission (Discharged[facility] ED from 05/03/2024 in [facility] Department [facility]  C-SSRS RISK CATEGORY Low Risk High Risk High Risk     Assessment and Plan: Patient reports that she has been more anxious, depressed, and inattentive.  She reports that she had psychological testing in 2024 and will attempt to get her medical records. If ineffective patient will be referred to [facility]. Today BuSpar  15 mg twice daily increased to 30 mg twice daily to help manage anxiety depression.  Patient also agreeable to starting Strattera 40 mg daily to help manage concentration.  1. Generalized anxiety disorder  Continue- fluvoxaMINE  (LUVOX ) 100 MG tablet; Take 1 tablet (100 mg total) by mouth 2 (two) times daily.  Dispense: 30 tablet; Refill: 3 increased- busPIRone  (BUSPAR ) 30 MG tablet; Take 1 tablet (30 mg total) by mouth 2 (two) times daily.  Dispense: 60 tablet; Refill: 3  2. Poor concentration (Primary)  Start- atomoxetine (STRATTERA) 40 MG capsule; Take 1 capsule (40 mg total) by mouth daily.  Dispense: 30 capsule; Refill: 3  Consent: Patient/Guardian gives verbal consent for treatment and assignment of benefits for services provided during this visit. Patient/Guardian expressed understanding and agreed to proceed.   Follow-up in 2.5 months Zane FORBES Bach, NP 11/03/2024, 12:03 PM

## 2024-11-07 ENCOUNTER — Other Ambulatory Visit (HOSPITAL_COMMUNITY): Payer: Self-pay

## 2024-11-07 MED ORDER — ONDANSETRON 4 MG PO TBDP
4.0000 mg | ORAL_TABLET | Freq: Three times a day (TID) | ORAL | 1 refills | Status: AC | PRN
  Filled 2024-11-07: qty 20, 7d supply, fill #0

## 2024-11-08 ENCOUNTER — Telehealth (HOSPITAL_COMMUNITY): Admitting: Psychiatry

## 2024-11-09 ENCOUNTER — Other Ambulatory Visit (HOSPITAL_COMMUNITY): Payer: Self-pay

## 2024-11-09 MED ORDER — SULFAMETHOXAZOLE-TRIMETHOPRIM 800-160 MG PO TABS
1.0000 | ORAL_TABLET | Freq: Two times a day (BID) | ORAL | 0 refills | Status: AC
  Filled 2024-11-09: qty 6, 3d supply, fill #0

## 2024-11-28 ENCOUNTER — Other Ambulatory Visit (HOSPITAL_COMMUNITY): Payer: Self-pay

## 2024-11-28 MED ORDER — NORGESTIMATE-ETH ESTRADIOL 0.25-35 MG-MCG PO TABS
1.0000 | ORAL_TABLET | Freq: Every day | ORAL | 0 refills | Status: AC
  Filled 2024-11-28: qty 56, 56d supply, fill #0

## 2024-12-01 ENCOUNTER — Other Ambulatory Visit (HOSPITAL_COMMUNITY): Payer: Self-pay

## 2024-12-04 ENCOUNTER — Other Ambulatory Visit (HOSPITAL_COMMUNITY): Payer: Self-pay | Admitting: Psychiatry

## 2024-12-04 ENCOUNTER — Other Ambulatory Visit (HOSPITAL_COMMUNITY): Payer: Self-pay

## 2024-12-04 DIAGNOSIS — F411 Generalized anxiety disorder: Secondary | ICD-10-CM

## 2024-12-05 ENCOUNTER — Other Ambulatory Visit: Payer: Self-pay

## 2024-12-05 ENCOUNTER — Other Ambulatory Visit (HOSPITAL_COMMUNITY): Payer: Self-pay

## 2024-12-05 ENCOUNTER — Other Ambulatory Visit (HOSPITAL_COMMUNITY): Payer: Self-pay | Admitting: Psychiatry

## 2024-12-05 DIAGNOSIS — F411 Generalized anxiety disorder: Secondary | ICD-10-CM

## 2024-12-05 MED ORDER — FLUVOXAMINE MALEATE 100 MG PO TABS
100.0000 mg | ORAL_TABLET | Freq: Two times a day (BID) | ORAL | 3 refills | Status: AC
  Filled 2024-12-05 – 2024-12-08 (×2): qty 30, 15d supply, fill #0

## 2024-12-08 ENCOUNTER — Other Ambulatory Visit: Payer: Self-pay

## 2024-12-08 ENCOUNTER — Other Ambulatory Visit (HOSPITAL_COMMUNITY): Payer: Self-pay

## 2025-01-10 ENCOUNTER — Telehealth (HOSPITAL_COMMUNITY): Admitting: Psychiatry

## 2025-02-08 ENCOUNTER — Ambulatory Visit: Admitting: Neurology
